# Patient Record
Sex: Male | Born: 1950 | Race: White | Hispanic: No | Marital: Single | Smoking: Never smoker
Health system: Southern US, Community
[De-identification: ages and names within clinical notes are randomized; demographics above are authoritative.]

## PROBLEM LIST (undated history)

## (undated) DIAGNOSIS — K219 Gastro-esophageal reflux disease without esophagitis: Secondary | ICD-10-CM

## (undated) DIAGNOSIS — R569 Unspecified convulsions: Secondary | ICD-10-CM

## (undated) DIAGNOSIS — IMO0001 Reserved for inherently not codable concepts without codable children: Secondary | ICD-10-CM

## (undated) DIAGNOSIS — S0592XA Unspecified injury of left eye and orbit, initial encounter: Secondary | ICD-10-CM

## (undated) DIAGNOSIS — M1990 Unspecified osteoarthritis, unspecified site: Secondary | ICD-10-CM

## (undated) DIAGNOSIS — J45909 Unspecified asthma, uncomplicated: Secondary | ICD-10-CM

## (undated) DIAGNOSIS — C801 Malignant (primary) neoplasm, unspecified: Secondary | ICD-10-CM

## (undated) DIAGNOSIS — I10 Essential (primary) hypertension: Secondary | ICD-10-CM

## (undated) HISTORY — PX: EYE SURGERY: SHX253

## (undated) HISTORY — PX: MOUTH SURGERY: SHX715

## (undated) HISTORY — PX: OTHER SURGICAL HISTORY: SHX169

---

## 2002-07-17 ENCOUNTER — Emergency Department (HOSPITAL_COMMUNITY): Admission: EM | Admit: 2002-07-17 | Discharge: 2002-07-17 | Payer: Self-pay | Admitting: Emergency Medicine

## 2002-07-17 ENCOUNTER — Encounter: Payer: Self-pay | Admitting: Emergency Medicine

## 2014-10-09 ENCOUNTER — Other Ambulatory Visit: Payer: Self-pay | Admitting: Gastroenterology

## 2014-10-09 DIAGNOSIS — K6389 Other specified diseases of intestine: Secondary | ICD-10-CM

## 2014-10-10 ENCOUNTER — Other Ambulatory Visit: Payer: Self-pay | Admitting: Urology

## 2014-10-10 DIAGNOSIS — C61 Malignant neoplasm of prostate: Secondary | ICD-10-CM

## 2014-10-11 ENCOUNTER — Ambulatory Visit
Admission: RE | Admit: 2014-10-11 | Discharge: 2014-10-11 | Disposition: A | Discharge status: Home / Self Care | Payer: PPO | Source: Ambulatory Visit | Attending: Gastroenterology | Admitting: Gastroenterology

## 2014-10-11 DIAGNOSIS — K6389 Other specified diseases of intestine: Secondary | ICD-10-CM

## 2014-10-11 MED ORDER — IOHEXOL 300 MG/ML  SOLN
100.0000 mL | Freq: Once | INTRAMUSCULAR | Status: AC | PRN
  Administered 2014-10-11: 100 mL via INTRAVENOUS

## 2014-10-16 ENCOUNTER — Other Ambulatory Visit (INDEPENDENT_AMBULATORY_CARE_PROVIDER_SITE_OTHER): Payer: Self-pay | Admitting: General Surgery

## 2014-10-16 DIAGNOSIS — D126 Benign neoplasm of colon, unspecified: Secondary | ICD-10-CM

## 2014-10-16 NOTE — H&P (Signed)
[name] 10/16/2014 9:41 AM Location: [facility] Patient #: [id number] DOB: 22-Mar-1951 Single / Language: [name] / Race: White Male History of Present Illness Charles Ruff MD; 04/11/8849 10:09 AM) Patient words: colon mass.  The patient is a 64 year old male who presents with colorectal cancer. This is a 64 year old male who presents to my office with a newly diagnosed sigmoid colon cancer. He underwent a colonoscopy with Dr. Watt Thornton for screening purposes. Of note he has been having rectal bleeding for several months. He denies any weight loss. He has also had changes in his bowel habits which have resulted in crampy abdominal pain and chronic diarrhea. He denies any chest pain with exertion. He has no cardiac history. He is a nonsmoker. He has 1 sister that was diagnosed with colon cancer. Other Problems [name], CMA; 10/16/2014 9:41 AM) Arthritis Asthma Colon Cancer Gastroesophageal Reflux Disease High blood pressure Prostate Cancer  Past Surgical History [name], CMA; 10/16/2014 9:41 AM) Oral Surgery  Medication History [name], CMA; 10/16/2014 9:43 AM) Lisinopril (20MG  Tablet, Oral) Active.  Social History [name], CMA; 10/16/2014 9:41 AM) Alcohol use Moderate alcohol use. Caffeine use Coffee. Illicit drug use Uses socially only. Tobacco use Never smoker.  Family History [name], [name]; 10/16/2014 9:41 AM) Alcohol Abuse Sister. Arthritis Father. Colon Cancer Father, Sister. Diabetes Mellitus Mother. Hypertension Sister. Ischemic Bowel Disease Sister. Prostate Cancer Father.     Review of Systems ([place]; 10/16/2014 9:41 AM) General Present- Appetite Loss, Fatigue and Night Sweats. Not Present- Chills, Fever, Weight Gain and Weight Loss. Skin Not Present- Change in Wart/Mole, Dryness, Hives, Jaundice, New Lesions, Non-Healing Wounds, Rash and Ulcer. HEENT Present- Seasonal Allergies and Wears  glasses/contact lenses. Not Present- Earache, Hearing Loss, Hoarseness, Nose Bleed, Oral Ulcers, Ringing in the Ears, Sinus Pain, Sore Throat, Visual Disturbances and Yellow Eyes. Respiratory Present- Snoring. Not Present- Bloody sputum, Chronic Cough, Difficulty Breathing and Wheezing. Cardiovascular Not Present- Chest Pain, Difficulty Breathing Lying Down, Leg Cramps, Palpitations, Rapid Heart Rate, Shortness of Breath and Swelling of Extremities. Gastrointestinal Present- Abdominal Pain, Excessive gas, Gets full quickly at meals and Indigestion. Not Present- Bloating, Bloody Stool, Change in Bowel Habits, Chronic diarrhea, Constipation, Difficulty Swallowing, Hemorrhoids, Nausea, Rectal Pain and Vomiting. Male Genitourinary Present- Change in Urinary Stream, Frequency and Nocturia. Not Present- Blood in Urine, Impotence, Painful Urination, Urgency and Urine Leakage.  Vitals ([name] CMA; 10/16/2014 9:43 AM) 10/16/2014 9:42 AM Weight: 186 lb Height: 69in Body Surface Area: 2.03 m Body Mass Index: 27.47 kg/m Temp.: 35F(Temporal)  Pulse: 80 (Regular)  BP: 116/70 (Sitting, Left Arm, Standard)     Physical Exam Charles Ruff MD; 10/14/7410 10:10 AM)  General Mental Status-Alert. General Appearance-Consistent with stated age. Hydration-Well hydrated. Voice-Normal.  Head and Neck Head-normocephalic, atraumatic with no lesions or palpable masses. Trachea-midline. Thyroid Gland Characteristics - normal size and consistency.  Eye Eyeball - Bilateral-Extraocular movements intact. Sclera/Conjunctiva - Bilateral-No scleral icterus.  Chest and Lung Exam Chest and lung exam reveals -quiet, even and easy respiratory effort with no use of accessory muscles and on auscultation, normal breath sounds, no adventitious sounds and normal vocal resonance. Inspection Chest Wall - Normal. Back - normal.  Cardiovascular Cardiovascular examination reveals -normal  heart sounds, regular rate and rhythm with no murmurs and normal pedal pulses bilaterally.  Abdomen Inspection Inspection of the abdomen reveals - No Hernias. Palpation/Percussion Palpation and Percussion of the abdomen reveal - Soft, Non Tender, No Rebound tenderness, No Rigidity (guarding) and  No hepatosplenomegaly. Auscultation Auscultation of the abdomen reveals - Bowel sounds normal.  Neurologic Neurologic evaluation reveals -alert and oriented x 3 with no impairment of recent or remote memory. Mental Status-Normal.  Musculoskeletal Global Assessment -Note:no gross deformities.  Normal Exam - Left-Upper Extremity Strength Normal and Lower Extremity Strength Normal. Normal Exam - Right-Upper Extremity Strength Normal and Lower Extremity Strength Normal.    Assessment & Plan Charles Ruff MD; 02/11/8937 10:10 AM)  MALIGNANT NEOPLASM OF SIGMOID COLON (153.3  C18.7) Story: This is a 64 year old male who was recently found to have a sigmoid colon mass on colonoscopy. This appeared partially obstructive. CT scan shows a large sigmoid mass that does not appear to be invading any external structures. There is no signs of metastatic disease. There is a lesion in the right middle lobe and is felt to be benign. We will complete his cancer workup by obtaining a CT scan of the chest and a CEA level. We will then schedule him for robotic sigmoidectomy. I will send this note to Dr. Dorina Thornton as well given his recent diagnosis of prostate cancer.

## 2014-10-19 ENCOUNTER — Inpatient Hospital Stay: Admission: RE | Admit: 2014-10-19 | Payer: PPO | Source: Ambulatory Visit

## 2014-10-19 ENCOUNTER — Ambulatory Visit
Admission: RE | Admit: 2014-10-19 | Discharge: 2014-10-19 | Disposition: A | Discharge status: Home / Self Care | Payer: PPO | Source: Ambulatory Visit | Attending: General Surgery | Admitting: General Surgery

## 2014-10-19 ENCOUNTER — Other Ambulatory Visit: Payer: Self-pay | Admitting: *Deleted

## 2014-10-19 ENCOUNTER — Other Ambulatory Visit: Payer: Self-pay | Admitting: General Surgery

## 2014-10-19 DIAGNOSIS — D126 Benign neoplasm of colon, unspecified: Secondary | ICD-10-CM

## 2014-10-19 MED ORDER — IOHEXOL 300 MG/ML  SOLN
75.0000 mL | Freq: Once | INTRAMUSCULAR | Status: AC | PRN
  Administered 2014-10-19: 75 mL via INTRAVENOUS

## 2014-10-24 ENCOUNTER — Encounter (HOSPITAL_COMMUNITY): Payer: PPO

## 2014-11-01 ENCOUNTER — Encounter (HOSPITAL_COMMUNITY)
Admission: RE | Admit: 2014-11-01 | Discharge: 2014-11-01 | Disposition: A | Discharge status: Home / Self Care | Payer: PPO | Source: Ambulatory Visit | Attending: Urology | Admitting: Urology

## 2014-11-01 DIAGNOSIS — C61 Malignant neoplasm of prostate: Secondary | ICD-10-CM

## 2014-11-01 MED ORDER — TECHNETIUM TC 99M MEDRONATE IV KIT
26.7000 | PACK | Freq: Once | INTRAVENOUS | Status: AC | PRN
  Administered 2014-11-01: 26.7 via INTRAVENOUS

## 2014-11-02 ENCOUNTER — Other Ambulatory Visit: Payer: Self-pay | Admitting: Urology

## 2014-11-06 ENCOUNTER — Encounter (HOSPITAL_COMMUNITY)
Admission: RE | Admit: 2014-11-06 | Discharge: 2014-11-06 | Disposition: A | Discharge status: Home / Self Care | Payer: PPO | Source: Ambulatory Visit | Attending: General Surgery | Admitting: General Surgery

## 2014-11-06 ENCOUNTER — Encounter (HOSPITAL_COMMUNITY): Payer: Self-pay

## 2014-11-06 HISTORY — DX: Reserved for inherently not codable concepts without codable children: IMO0001

## 2014-11-06 HISTORY — DX: Unspecified asthma, uncomplicated: J45.909

## 2014-11-06 HISTORY — DX: Gastro-esophageal reflux disease without esophagitis: K21.9

## 2014-11-06 HISTORY — DX: Unspecified convulsions: R56.9

## 2014-11-06 HISTORY — DX: Unspecified injury of left eye and orbit, initial encounter: S05.92XA

## 2014-11-06 HISTORY — DX: Unspecified osteoarthritis, unspecified site: M19.90

## 2014-11-06 HISTORY — DX: Essential (primary) hypertension: I10

## 2014-11-06 HISTORY — DX: Malignant (primary) neoplasm, unspecified: C80.1

## 2014-11-06 LAB — BASIC METABOLIC PANEL
Anion gap: 7 (ref 5–15)
BUN: 7 mg/dL (ref 6–23)
CHLORIDE: 105 mmol/L (ref 96–112)
CO2: 25 mmol/L (ref 19–32)
Calcium: 8.7 mg/dL (ref 8.4–10.5)
Creatinine, Ser: 0.7 mg/dL (ref 0.50–1.35)
GFR calc Af Amer: >90 mL/min (ref >90)
GFR calc non Af Amer: >90 mL/min (ref >90)
Glucose, Bld: 91 mg/dL (ref 70–99)
Potassium: 4.1 mmol/L (ref 3.5–5.1)
SODIUM: 137 mmol/L (ref 135–145)

## 2014-11-06 LAB — CBC
HCT: 37.4 % — ABNORMAL LOW (ref 39.0–52.0)
HEMOGLOBIN: 11.5 g/dL — AB (ref 13.0–17.0)
MCH: 25.6 pg — AB (ref 26.0–34.0)
MCHC: 30.7 g/dL (ref 30.0–36.0)
MCV: 83.1 fL (ref 78.0–100.0)
PLATELETS: 306 10*3/uL (ref 150–400)
RBC: 4.5 MIL/uL (ref 4.22–5.81)
RDW: 16.6 % — AB (ref 11.5–15.5)
WBC: 6.9 10*3/uL (ref 4.0–10.5)

## 2014-11-06 LAB — ABO/RH: ABO/RH(D): O POS

## 2014-11-06 NOTE — Progress Notes (Signed)
Your patient has screened at an elevated risk for Obstructive Sleep Apnea using the Stop-Bang Tool during a pre-surgical vist. A score of 4 or greater is an elevated risk. Score of 6. 

## 2014-11-06 NOTE — Patient Instructions (Signed)
[place]  11/06/2014   Your procedure is scheduled on:       Wednesday, November 07, 2014   Report to [facility] Main Entrance and follow signs to  [facility] arrive at  6:15 AM.   Call this number if you have problems the morning of surgery [phone] or Presurgical Testing [phone].   Remember:  Do not eat food or drink liquids :After Midnight.  For Living Will and/or Health Care Power Attorney Forms: please provide copy for your medical record, may bring AM of surgery (forms should be already notarized-we do not provide this service).  Remember: follow any bowel prep instructions per MD office.   Take these medicines the morning of surgery with A SIP OF WATER: NONE                               You may not have any metal on your body including hair pins and piercings  Do not wear jewelry,colognes, lotions, powders, or deodorant.  Men may shave face and neck.               Do not bring valuables to the hospital. [place].  Contacts, dentures or bridgework may not be worn into surgery.  Leave suitcase in the car. After surgery it may be brought to your room.  For patients admitted to the hospital, checkout time is 11:00 AM the day of discharge.   Special Instructions: review fact sheets for Blood Transfusion fact sheet.  Remember: Type/Screen "Blue armsbands"- may not be removed once applied(would result in being retested AM of surgery, if removed). ________________________________________________________________________  [facility] - Preparing for Surgery Before surgery, you can play an important role.  Because skin is not sterile, your skin needs to be as free of germs as possible.  You can reduce the number of germs on your skin by washing with CHG (chlorahexidine gluconate) soap before surgery.  CHG is an antiseptic cleaner which kills germs and bonds with the skin to continue killing germs even after  washing. Please DO NOT use if you have an allergy to CHG or antibacterial soaps.  If your skin becomes reddened/irritated stop using the CHG and inform your nurse when you arrive at Short Stay. Do not shave (including legs and underarms) for at least 48 hours prior to the first CHG shower.  You may shave your face/neck. Please follow these instructions carefully:  1.  Shower with CHG Soap the night before surgery and the  morning of Surgery.  2.  If you choose to wash your hair, wash your hair first as usual with your  normal  shampoo.  3.  After you shampoo, rinse your hair and body thoroughly to remove the  shampoo.                           4.  Use CHG as you would any other liquid soap.  You can apply chg directly  to the skin and wash                       Gently with a scrungie or clean washcloth.  5.  Apply the CHG Soap to your body ONLY FROM THE NECK DOWN.   Do not use on face/ open  Wound or open sores. Avoid contact with eyes, ears mouth and genitals (private parts).                       Wash face,  Genitals (private parts) with your normal soap.             6.  Wash thoroughly, paying special attention to the area where your surgery  will be performed.  7.  Thoroughly rinse your body with warm water from the neck down.  8.  DO NOT shower/wash with your normal soap after using and rinsing off  the CHG Soap.                9.  Pat yourself dry with a clean towel.            10.  Wear clean pajamas.            11.  Place clean sheets on your bed the night of your first shower and do not  sleep with pets. Day of Surgery : Do not apply any lotions/deodorants the morning of surgery.  Please wear clean clothes to the hospital/surgery center.  FAILURE TO FOLLOW THESE INSTRUCTIONS MAY RESULT IN THE CANCELLATION OF YOUR SURGERY PATIENT SIGNATURE_________________________________  NURSE  SIGNATURE__________________________________  ________________________________________________________________________

## 2014-11-06 NOTE — Progress Notes (Addendum)
CT Chest epic 10/19/2014  CBC results per epic per PAT visit 11/06/2014 sent to Joyice Faster MD

## 2014-11-07 ENCOUNTER — Inpatient Hospital Stay (HOSPITAL_COMMUNITY): Payer: PPO | Admitting: Anesthesiology

## 2014-11-07 ENCOUNTER — Inpatient Hospital Stay (HOSPITAL_COMMUNITY)
Admission: RE | Admit: 2014-11-07 | Discharge: 2014-11-11 | DRG: 707 | Disposition: A | Discharge status: Home / Self Care | Payer: PPO | Source: Ambulatory Visit | Attending: General Surgery | Admitting: General Surgery

## 2014-11-07 ENCOUNTER — Encounter (HOSPITAL_COMMUNITY)
Admission: RE | Disposition: A | Discharge status: Home / Self Care | Payer: Self-pay | Source: Ambulatory Visit | Attending: General Surgery

## 2014-11-07 ENCOUNTER — Encounter (HOSPITAL_COMMUNITY): Payer: Self-pay | Admitting: *Deleted

## 2014-11-07 DIAGNOSIS — D62 Acute posthemorrhagic anemia: Secondary | ICD-10-CM | POA: Diagnosis not present

## 2014-11-07 DIAGNOSIS — C187 Malignant neoplasm of sigmoid colon: Secondary | ICD-10-CM | POA: Diagnosis present

## 2014-11-07 DIAGNOSIS — K219 Gastro-esophageal reflux disease without esophagitis: Secondary | ICD-10-CM | POA: Diagnosis present

## 2014-11-07 DIAGNOSIS — C61 Malignant neoplasm of prostate: Secondary | ICD-10-CM | POA: Diagnosis present

## 2014-11-07 DIAGNOSIS — Z79899 Other long term (current) drug therapy: Secondary | ICD-10-CM

## 2014-11-07 DIAGNOSIS — Z7952 Long term (current) use of systemic steroids: Secondary | ICD-10-CM

## 2014-11-07 DIAGNOSIS — J45909 Unspecified asthma, uncomplicated: Secondary | ICD-10-CM | POA: Diagnosis present

## 2014-11-07 DIAGNOSIS — C189 Malignant neoplasm of colon, unspecified: Secondary | ICD-10-CM | POA: Diagnosis present

## 2014-11-07 DIAGNOSIS — Z800 Family history of malignant neoplasm of digestive organs: Secondary | ICD-10-CM

## 2014-11-07 DIAGNOSIS — Z01812 Encounter for preprocedural laboratory examination: Secondary | ICD-10-CM | POA: Diagnosis not present

## 2014-11-07 DIAGNOSIS — I10 Essential (primary) hypertension: Secondary | ICD-10-CM | POA: Diagnosis present

## 2014-11-07 HISTORY — PX: ROBOT ASSISTED LAPAROSCOPIC RADICAL PROSTATECTOMY: SHX5141

## 2014-11-07 HISTORY — PX: PROCTOSCOPY: SHX2266

## 2014-11-07 LAB — TYPE AND SCREEN
ABO/RH(D): O POS
ANTIBODY SCREEN: NEGATIVE

## 2014-11-07 LAB — HEMOGLOBIN A1C
Hgb A1c MFr Bld: 6 % — ABNORMAL HIGH (ref 4.8–5.6)
Mean Plasma Glucose: 126 mg/dL

## 2014-11-07 SURGERY — COLECTOMY, PARTIAL, ROBOT-ASSISTED, LAPAROSCOPIC
Anesthesia: General | Site: Abdomen

## 2014-11-07 MED ORDER — HEPARIN SODIUM (PORCINE) 5000 UNIT/ML IJ SOLN
5000.0000 [IU] | Freq: Three times a day (TID) | INTRAMUSCULAR | Status: DC
  Filled 2014-11-07: qty 1

## 2014-11-07 MED ORDER — ONDANSETRON HCL 4 MG/2ML IJ SOLN
INTRAMUSCULAR | Status: AC
  Filled 2014-11-07: qty 2

## 2014-11-07 MED ORDER — HYDRALAZINE HCL 20 MG/ML IJ SOLN
INTRAMUSCULAR | Status: AC
  Filled 2014-11-07: qty 1

## 2014-11-07 MED ORDER — INDOCYANINE GREEN 25 MG IV SOLR
INTRAVENOUS | Status: DC | PRN
  Administered 2014-11-07: .4 mg

## 2014-11-07 MED ORDER — BUPIVACAINE-EPINEPHRINE 0.25% -1:200000 IJ SOLN
INTRAMUSCULAR | Status: DC | PRN
  Administered 2014-11-07: 24 mL

## 2014-11-07 MED ORDER — LACTATED RINGERS IR SOLN
Status: DC | PRN
  Administered 2014-11-07: 2000 mL

## 2014-11-07 MED ORDER — DIPHENHYDRAMINE HCL 50 MG/ML IJ SOLN: 12.5000 mg | Freq: Four times a day (QID) | INTRAMUSCULAR | Status: DC | PRN

## 2014-11-07 MED ORDER — MIDAZOLAM HCL 5 MG/5ML IJ SOLN
INTRAMUSCULAR | Status: DC | PRN
  Administered 2014-11-07: 2 mg via INTRAVENOUS

## 2014-11-07 MED ORDER — PHENYLEPHRINE HCL 10 MG/ML IJ SOLN
INTRAMUSCULAR | Status: DC | PRN
  Administered 2014-11-07: 80 ug via INTRAVENOUS
  Administered 2014-11-07: 40 ug via INTRAVENOUS

## 2014-11-07 MED ORDER — 0.9 % SODIUM CHLORIDE (POUR BTL) OPTIME
TOPICAL | Status: DC | PRN
  Administered 2014-11-07: 2000 mL

## 2014-11-07 MED ORDER — HYDROMORPHONE HCL 1 MG/ML IJ SOLN
INTRAMUSCULAR | Status: AC
  Filled 2014-11-07: qty 1

## 2014-11-07 MED ORDER — SODIUM CHLORIDE 0.9 % IJ SOLN: 9.0000 mL | INTRAMUSCULAR | Status: DC | PRN

## 2014-11-07 MED ORDER — LACTATED RINGERS IV SOLN
INTRAVENOUS | Status: DC | PRN
  Administered 2014-11-07 (×2): via INTRAVENOUS

## 2014-11-07 MED ORDER — ACETAMINOPHEN 500 MG PO TABS
1000.0000 mg | ORAL_TABLET | Freq: Four times a day (QID) | ORAL | Status: AC
  Filled 2014-11-07 (×2): qty 2

## 2014-11-07 MED ORDER — SUFENTANIL CITRATE 50 MCG/ML IV SOLN
INTRAVENOUS | Status: AC
  Filled 2014-11-07: qty 1

## 2014-11-07 MED ORDER — DEXTROSE 5 % IV SOLN
2.0000 g | Freq: Two times a day (BID) | INTRAVENOUS | Status: DC
  Administered 2014-11-07 – 2014-11-08 (×4): 2 g via INTRAVENOUS
  Filled 2014-11-07 (×6): qty 2

## 2014-11-07 MED ORDER — MIDAZOLAM HCL 2 MG/2ML IJ SOLN
INTRAMUSCULAR | Status: AC
  Filled 2014-11-07: qty 2

## 2014-11-07 MED ORDER — METOCLOPRAMIDE HCL 5 MG/ML IJ SOLN
INTRAMUSCULAR | Status: AC
  Filled 2014-11-07: qty 2

## 2014-11-07 MED ORDER — CEFOTETAN DISODIUM 2 G IJ SOLR: 2.0000 g | Freq: Two times a day (BID) | INTRAMUSCULAR | Status: DC

## 2014-11-07 MED ORDER — CEFOTETAN DISODIUM-DEXTROSE 2-2.08 GM-% IV SOLR
INTRAVENOUS | Status: AC
  Filled 2014-11-07: qty 50

## 2014-11-07 MED ORDER — LABETALOL HCL 5 MG/ML IV SOLN
INTRAVENOUS | Status: DC | PRN
  Administered 2014-11-07 (×2): 5 mg via INTRAVENOUS

## 2014-11-07 MED ORDER — KCL IN DEXTROSE-NACL 20-5-0.45 MEQ/L-%-% IV SOLN: INTRAVENOUS | Status: DC

## 2014-11-07 MED ORDER — MORPHINE SULFATE (PF) 1 MG/ML IV SOLN
INTRAVENOUS | Status: AC
  Filled 2014-11-07: qty 25

## 2014-11-07 MED ORDER — CEFOTETAN DISODIUM 2 G IJ SOLR
2.0000 g | INTRAMUSCULAR | Status: AC
  Administered 2014-11-07: 2 g via INTRAVENOUS

## 2014-11-07 MED ORDER — ENOXAPARIN SODIUM 40 MG/0.4ML ~~LOC~~ SOLN
40.0000 mg | SUBCUTANEOUS | Status: DC
  Administered 2014-11-08 – 2014-11-11 (×4): 40 mg via SUBCUTANEOUS
  Filled 2014-11-07 (×6): qty 0.4

## 2014-11-07 MED ORDER — HYDRALAZINE HCL 20 MG/ML IJ SOLN
10.0000 mg | Freq: Once | INTRAMUSCULAR | Status: AC
  Administered 2014-11-07: 10 mg via INTRAVENOUS

## 2014-11-07 MED ORDER — LABETALOL HCL 5 MG/ML IV SOLN
INTRAVENOUS | Status: AC
  Filled 2014-11-07: qty 4

## 2014-11-07 MED ORDER — LACTATED RINGERS IV SOLN: INTRAVENOUS | Status: DC

## 2014-11-07 MED ORDER — SODIUM CHLORIDE 0.9 % IV BOLUS (SEPSIS)
500.0000 mL | Freq: Once | INTRAVENOUS | Status: AC
  Administered 2014-11-07: 500 mL via INTRAVENOUS

## 2014-11-07 MED ORDER — KCL IN DEXTROSE-NACL 20-5-0.45 MEQ/L-%-% IV SOLN
INTRAVENOUS | Status: DC
  Administered 2014-11-07 – 2014-11-08 (×2): 100 mL via INTRAVENOUS
  Administered 2014-11-08: 19:00:00 via INTRAVENOUS
  Filled 2014-11-07 (×4): qty 1000

## 2014-11-07 MED ORDER — BUPIVACAINE-EPINEPHRINE (PF) 0.25% -1:200000 IJ SOLN
INTRAMUSCULAR | Status: AC
Start: 2014-11-07 — End: 2014-11-07
  Filled 2014-11-07: qty 30

## 2014-11-07 MED ORDER — LACTATED RINGERS IV SOLN
INTRAVENOUS | Status: DC | PRN
  Administered 2014-11-07: 09:00:00 via INTRAVENOUS

## 2014-11-07 MED ORDER — NEOSTIGMINE METHYLSULFATE 10 MG/10ML IV SOLN
INTRAVENOUS | Status: DC | PRN
  Administered 2014-11-07: 5 mg via INTRAVENOUS

## 2014-11-07 MED ORDER — SUFENTANIL CITRATE 50 MCG/ML IV SOLN
INTRAVENOUS | Status: DC | PRN
  Administered 2014-11-07 (×8): 10 ug via INTRAVENOUS
  Administered 2014-11-07: 20 ug via INTRAVENOUS

## 2014-11-07 MED ORDER — ONDANSETRON HCL 4 MG/2ML IJ SOLN
INTRAMUSCULAR | Status: DC | PRN
Start: 2014-11-07 — End: 2014-11-07
  Administered 2014-11-07: 4 mg via INTRAVENOUS

## 2014-11-07 MED ORDER — CISATRACURIUM BESYLATE 20 MG/10ML IV SOLN
INTRAVENOUS | Status: AC
  Filled 2014-11-07: qty 20

## 2014-11-07 MED ORDER — NALOXONE HCL 0.4 MG/ML IJ SOLN: 0.4000 mg | INTRAMUSCULAR | Status: DC | PRN

## 2014-11-07 MED ORDER — SODIUM CHLORIDE 0.9 % IJ SOLN
INTRAMUSCULAR | Status: AC
  Filled 2014-11-07: qty 10

## 2014-11-07 MED ORDER — HEPARIN SODIUM (PORCINE) 5000 UNIT/ML IJ SOLN
5000.0000 [IU] | Freq: Three times a day (TID) | INTRAMUSCULAR | Status: AC
  Administered 2014-11-07: 5000 [IU] via SUBCUTANEOUS

## 2014-11-07 MED ORDER — DIPHENHYDRAMINE HCL 12.5 MG/5ML PO ELIX: 12.5000 mg | ORAL_SOLUTION | Freq: Four times a day (QID) | ORAL | Status: DC | PRN

## 2014-11-07 MED ORDER — HYDROCODONE-ACETAMINOPHEN 5-325 MG PO TABS: 1.0000 | ORAL_TABLET | Freq: Four times a day (QID) | ORAL | Status: DC | PRN

## 2014-11-07 MED ORDER — DEXAMETHASONE SODIUM PHOSPHATE 10 MG/ML IJ SOLN
INTRAMUSCULAR | Status: DC | PRN
  Administered 2014-11-07: 10 mg via INTRAVENOUS

## 2014-11-07 MED ORDER — MORPHINE SULFATE (PF) 1 MG/ML IV SOLN
INTRAVENOUS | Status: DC
  Administered 2014-11-07: 16:00:00 via INTRAVENOUS
  Administered 2014-11-07: 7.5 mg via INTRAVENOUS
  Administered 2014-11-08: 2.1 mg via INTRAVENOUS
  Administered 2014-11-08 (×2): 6 mg via INTRAVENOUS
  Administered 2014-11-08: 7.5 mg via INTRAVENOUS
  Administered 2014-11-08: 6 mg via INTRAVENOUS
  Administered 2014-11-08 (×2): 3 mg via INTRAVENOUS
  Administered 2014-11-09: 1.5 mg via INTRAVENOUS
  Administered 2014-11-09: 4.5 mg via INTRAVENOUS
  Administered 2014-11-09: 6 mg via INTRAVENOUS
  Administered 2014-11-09: 4.5 mg via INTRAVENOUS
  Filled 2014-11-07 (×2): qty 25

## 2014-11-07 MED ORDER — DEXAMETHASONE SODIUM PHOSPHATE 10 MG/ML IJ SOLN
INTRAMUSCULAR | Status: AC
  Filled 2014-11-07: qty 1

## 2014-11-07 MED ORDER — GLYCOPYRROLATE 0.2 MG/ML IJ SOLN
INTRAMUSCULAR | Status: DC | PRN
  Administered 2014-11-07: .8 mg via INTRAVENOUS

## 2014-11-07 MED ORDER — CISATRACURIUM BESYLATE (PF) 10 MG/5ML IV SOLN
INTRAVENOUS | Status: DC | PRN
  Administered 2014-11-07: 4 mg via INTRAVENOUS
  Administered 2014-11-07: 6 mg via INTRAVENOUS
  Administered 2014-11-07: 3 mg via INTRAVENOUS
  Administered 2014-11-07: 6 mg via INTRAVENOUS
  Administered 2014-11-07: 2 mg via INTRAVENOUS
  Administered 2014-11-07: 3 mg via INTRAVENOUS
  Administered 2014-11-07: 14 mg via INTRAVENOUS

## 2014-11-07 MED ORDER — INDOCYANINE GREEN 25 MG IV SOLR
INTRAVENOUS | Status: DC | PRN
  Administered 2014-11-07: 6.25 mg via INTRAVENOUS

## 2014-11-07 MED ORDER — PROMETHAZINE HCL 25 MG/ML IJ SOLN: 6.2500 mg | INTRAMUSCULAR | Status: DC | PRN

## 2014-11-07 MED ORDER — PROPOFOL 10 MG/ML IV BOLUS
INTRAVENOUS | Status: DC | PRN
  Administered 2014-11-07: 150 mg via INTRAVENOUS

## 2014-11-07 MED ORDER — ONDANSETRON HCL 4 MG/2ML IJ SOLN
4.0000 mg | Freq: Four times a day (QID) | INTRAMUSCULAR | Status: DC | PRN
Start: 2014-11-07 — End: 2014-11-09

## 2014-11-07 MED ORDER — CIPROFLOXACIN HCL 500 MG PO TABS: 500.0000 mg | ORAL_TABLET | Freq: Two times a day (BID) | ORAL | Status: DC

## 2014-11-07 MED ORDER — ALVIMOPAN 12 MG PO CAPS
12.0000 mg | ORAL_CAPSULE | Freq: Once | ORAL | Status: AC
  Administered 2014-11-07: 12 mg via ORAL
  Filled 2014-11-07: qty 1

## 2014-11-07 MED ORDER — METOCLOPRAMIDE HCL 5 MG/ML IJ SOLN
INTRAMUSCULAR | Status: DC | PRN
  Administered 2014-11-07: 10 mg via INTRAVENOUS

## 2014-11-07 MED ORDER — PROPOFOL 10 MG/ML IV BOLUS
INTRAVENOUS | Status: AC
  Filled 2014-11-07: qty 20

## 2014-11-07 MED ORDER — HYDROMORPHONE HCL 1 MG/ML IJ SOLN
0.2500 mg | INTRAMUSCULAR | Status: DC | PRN
  Administered 2014-11-07 (×2): 0.5 mg via INTRAVENOUS

## 2014-11-07 MED ORDER — HYDRALAZINE HCL 20 MG/ML IJ SOLN
INTRAMUSCULAR | Status: DC | PRN
  Administered 2014-11-07 (×2): 4 mg via INTRAVENOUS

## 2014-11-07 SURGICAL SUPPLY — 115 items
BLADE EXTENDED COATED 6.5IN (ELECTRODE) ×4 IMPLANT
BLADE SURG SZ11 CARB STEEL (BLADE) ×4 IMPLANT
CABLE HIGH FREQUENCY MONO STRZ (ELECTRODE) ×2 IMPLANT
CANNULA REDUC XI 12-8 STAPL (CANNULA) ×1
CANNULA REDUC XI 12-8MM STAPL (CANNULA) ×1
CANNULA REDUCER 12-8 DVNC XI (CANNULA) ×2 IMPLANT
CATH COUDE 24FR 5CC (CATHETERS) IMPLANT
CATH FOLEY 2WAY SLVR 18FR 30CC (CATHETERS) ×4 IMPLANT
CATH RIBBED COUDE  30CC (CATHETERS) ×2
CATH RIBBED COUDE 30CC (CATHETERS) ×2
CATH ROBINSON RED A/P 16FR (CATHETERS) ×4 IMPLANT
CATH TIEMANN FOLEY 18FR 5CC (CATHETERS) ×4 IMPLANT
CELLS DAT CNTRL 66122 CELL SVR (MISCELLANEOUS) IMPLANT
CHLORAPREP W/TINT 26ML (MISCELLANEOUS) ×4 IMPLANT
CLIP LIGATING HEM O LOK PURPLE (MISCELLANEOUS) ×14 IMPLANT
CLIP LIGATING HEMOLOK MED (MISCELLANEOUS) IMPLANT
CLOTH BEACON ORANGE TIMEOUT ST (SAFETY) ×2 IMPLANT
COVER MAYO STAND STRL (DRAPES) ×2 IMPLANT
COVER TIP SHEARS 8 DVNC (MISCELLANEOUS) ×4 IMPLANT
COVER TIP SHEARS 8MM DA VINCI (MISCELLANEOUS) ×4
CUTTER ECHEON FLEX ENDO 45 340 (ENDOMECHANICALS) ×6 IMPLANT
DECANTER SPIKE VIAL GLASS SM (MISCELLANEOUS) ×8 IMPLANT
DEVICE TROCAR PUNCTURE CLOSURE (ENDOMECHANICALS) IMPLANT
DRAPE ARM DVNC X/XI (DISPOSABLE) ×16 IMPLANT
DRAPE COLUMN DVNC XI (DISPOSABLE) ×4 IMPLANT
DRAPE DA VINCI XI ARM (DISPOSABLE) ×16
DRAPE DA VINCI XI COLUMN (DISPOSABLE) ×4
DRAPE LAPAROSCOPIC ABDOMINAL (DRAPES) ×4 IMPLANT
DRAPE SURG IRRIG POUCH 19X23 (DRAPES) ×4 IMPLANT
DRSG OPSITE POSTOP 4X10 (GAUZE/BANDAGES/DRESSINGS) IMPLANT
DRSG OPSITE POSTOP 4X6 (GAUZE/BANDAGES/DRESSINGS) IMPLANT
DRSG OPSITE POSTOP 4X8 (GAUZE/BANDAGES/DRESSINGS) IMPLANT
DRSG TEGADERM 4X4.75 (GAUZE/BANDAGES/DRESSINGS) ×6 IMPLANT
DRSG TEGADERM 6X8 (GAUZE/BANDAGES/DRESSINGS) ×8 IMPLANT
ELECT PENCIL ROCKER SW 15FT (MISCELLANEOUS) ×8 IMPLANT
ELECT REM PT RETURN 15FT ADLT (MISCELLANEOUS) ×4 IMPLANT
ELECT REM PT RETURN 9FT ADLT (ELECTROSURGICAL) ×4
ELECTRODE REM PT RTRN 9FT ADLT (ELECTROSURGICAL) ×2 IMPLANT
EVACUATOR SILICONE 100CC (DRAIN) ×2 IMPLANT
GAUZE SPONGE 2X2 8PLY STRL LF (GAUZE/BANDAGES/DRESSINGS) ×2 IMPLANT
GAUZE SPONGE 4X4 12PLY STRL (GAUZE/BANDAGES/DRESSINGS) IMPLANT
GLOVE BIO SURGEON STRL SZ 6.5 (GLOVE) ×12 IMPLANT
GLOVE BIO SURGEONS STRL SZ 6.5 (GLOVE) ×4
GLOVE BIOGEL M STRL SZ7.5 (GLOVE) ×8 IMPLANT
GLOVE BIOGEL PI IND STRL 7.0 (GLOVE) ×6 IMPLANT
GLOVE BIOGEL PI INDICATOR 7.0 (GLOVE) ×6
GOWN STRL REUS W/TWL 2XL LVL3 (GOWN DISPOSABLE) ×12 IMPLANT
GOWN STRL REUS W/TWL LRG LVL3 (GOWN DISPOSABLE) ×8 IMPLANT
GOWN STRL REUS W/TWL LRG LVL4 (GOWN DISPOSABLE) ×12 IMPLANT
GOWN STRL REUS W/TWL XL LVL3 (GOWN DISPOSABLE) ×16 IMPLANT
HOLDER FOLEY CATH W/STRAP (MISCELLANEOUS) ×4 IMPLANT
IV LACTATED RINGERS 1000ML (IV SOLUTION) ×4 IMPLANT
KIT PROCEDURE DA VINCI SI (MISCELLANEOUS)
KIT PROCEDURE DVNC SI (MISCELLANEOUS) ×2 IMPLANT
LEGGING LITHOTOMY PAIR STRL (DRAPES) ×4 IMPLANT
LIQUID BAND (GAUZE/BANDAGES/DRESSINGS) ×4 IMPLANT
NDL INSUFFLATION 14GA 120MM (NEEDLE) ×4 IMPLANT
NEEDLE INSUFFLATION 14GA 120MM (NEEDLE) ×8 IMPLANT
PACK CARDIOVASCULAR III (CUSTOM PROCEDURE TRAY) ×4 IMPLANT
PACK COLON (CUSTOM PROCEDURE TRAY) ×4 IMPLANT
PACK GENERAL/GYN (CUSTOM PROCEDURE TRAY) ×4 IMPLANT
PACK ROBOT UROLOGY CUSTOM (CUSTOM PROCEDURE TRAY) ×4 IMPLANT
PAD POSITIONING PINK XL (MISCELLANEOUS) ×2 IMPLANT
PLUG CATH AND CAP STER (CATHETERS) ×2 IMPLANT
PORT LAP GEL ALEXIS MED 5-9CM (MISCELLANEOUS) IMPLANT
RELOAD GREEN ECHELON 45 (STAPLE) ×4 IMPLANT
RELOAD STAPLE 45 BLU REG DVNC (STAPLE) IMPLANT
RETRACTOR WND ALEXIS 18 MED (MISCELLANEOUS) IMPLANT
RTRCTR WOUND ALEXIS 18CM MED (MISCELLANEOUS)
SCISSORS LAP 5X45 EPIX DISP (ENDOMECHANICALS) ×4 IMPLANT
SEAL CANN UNIV 5-8 DVNC XI (MISCELLANEOUS) ×14 IMPLANT
SEAL XI 5MM-8MM UNIVERSAL (MISCELLANEOUS) ×16
SEALER VESSEL DA VINCI XI (MISCELLANEOUS) ×2
SEALER VESSEL EXT DVNC XI (MISCELLANEOUS) ×2 IMPLANT
SET IRRIG TUBING LAPAROSCOPIC (IRRIGATION / IRRIGATOR) ×4 IMPLANT
SET TUBE IRRIG SUCTION NO TIP (IRRIGATION / IRRIGATOR) ×4 IMPLANT
SHEET LAVH (DRAPES) IMPLANT
SLEEVE XCEL OPT CAN 5 100 (ENDOMECHANICALS) IMPLANT
SOLUTION ANTI FOG 6CC (MISCELLANEOUS) ×2 IMPLANT
SOLUTION ELECTROLUBE (MISCELLANEOUS) ×8 IMPLANT
SPONGE GAUZE 2X2 STER 10/PKG (GAUZE/BANDAGES/DRESSINGS) ×2
SPONGE LAP 4X18 X RAY DECT (DISPOSABLE) ×4 IMPLANT
STAPLER 45 BLU RELOAD XI (STAPLE) ×6 IMPLANT
STAPLER 45 BLUE RELOAD XI (STAPLE) ×6
STAPLER CANNULA SEAL DVNC XI (STAPLE) IMPLANT
STAPLER CANNULA SEAL XI (STAPLE) ×2
STAPLER CIRC ILS CVD 33MM 37CM (STAPLE) ×2 IMPLANT
STAPLER SHEATH (SHEATH)
STAPLER SHEATH ENDOWRIST DVNC (SHEATH) IMPLANT
STAPLER VISISTAT 35W (STAPLE) ×4 IMPLANT
SUT ETHILON 3 0 PS 1 (SUTURE) ×4 IMPLANT
SUT NOVA NAB DX-16 0-1 5-0 T12 (SUTURE) ×4 IMPLANT
SUT PROLENE 2 0 KS (SUTURE) ×2 IMPLANT
SUT PROLENE 2 0 SH DA (SUTURE) ×2 IMPLANT
SUT SILK 2 0 SH CR/8 (SUTURE) ×4 IMPLANT
SUT SILK 3 0 SH CR/8 (SUTURE) ×4 IMPLANT
SUT VIC AB 2-0 SH 18 (SUTURE) IMPLANT
SUT VIC AB 4-0 PS2 18 (SUTURE) ×2 IMPLANT
SUT VLOC 180 2-0 9IN GS21 (SUTURE) ×4 IMPLANT
SUT VLOC BARB 180 ABS3/0GR12 (SUTURE) ×12
SUTURE VLOC BRB 180 ABS3/0GR12 (SUTURE) ×6 IMPLANT
SYR 27GX1/2 1ML LL SAFETY (SYRINGE) ×4 IMPLANT
SYR 30ML LL (SYRINGE) ×2 IMPLANT
SYRINGE 10CC LL (SYRINGE) ×4 IMPLANT
SYS LAPSCP GELPORT 120MM (MISCELLANEOUS)
SYSTEM LAPSCP GELPORT 120MM (MISCELLANEOUS) IMPLANT
TOWEL OR 17X26 10 PK STRL BLUE (TOWEL DISPOSABLE) ×4 IMPLANT
TOWEL OR NON WOVEN STRL DISP B (DISPOSABLE) ×8 IMPLANT
TRAY FOLEY CATH 14FRSI W/METER (CATHETERS) ×2 IMPLANT
TROCAR BLADELESS OPT 5 100 (ENDOMECHANICALS) ×6 IMPLANT
TROCAR XCEL 12X100 BLDLESS (ENDOMECHANICALS) ×2 IMPLANT
TUBING CONNECTING 10 (TUBING) IMPLANT
TUBING CONNECTING 10' (TUBING)
TUBING FILTER THERMOFLATOR (ELECTROSURGICAL) ×4 IMPLANT
WATER STERILE IRR 1500ML POUR (IV SOLUTION) ×8 IMPLANT

## 2014-11-07 NOTE — Anesthesia Postprocedure Evaluation (Signed)
  Anesthesia Post-op Note  Patient: [name]  Procedure(s) Performed: Procedure(s) (LRB): XI ROBOT ASSISTED LAPAROSCOPIC PARTIAL COLECTOMY (N/A) XI ROBOTIC ASSISTED LAPAROSCOPIC RADICAL PROSTATECTOMY WITH NODES (N/A) PROCTOSCOPY  Patient Location: PACU  Anesthesia Type: General  Level of Consciousness: awake and alert   Airway and Oxygen Therapy: Patient Spontanous Breathing  Post-op Pain: mild  Post-op Assessment: Post-op Vital signs reviewed, Patient's Cardiovascular Status Stable, Respiratory Function Stable, Patent Airway and No signs of Nausea or vomiting  Last Vitals:  Filed Vitals:   11/07/14 1630  BP: 167/72  Pulse: 99  Temp:   Resp: 16    Post-op Vital Signs: stable   Complications: No apparent anesthesia complications

## 2014-11-07 NOTE — Brief Op Note (Signed)
11/07/2014  3:11 PM  PATIENT:  [name]  64 y.o. male  PRE-OPERATIVE DIAGNOSIS:  High risk prostate cancer  POST-OPERATIVE DIAGNOSIS:   High risk prostate cancer  PROCEDURE:  Procedure(s): XI ROBOT ASSISTED LAPAROSCOPIC PARTIAL COLECTOMY (N/A) XI ROBOTIC ASSISTED LAPAROSCOPIC RADICAL PROSTATECTOMY WITH NODES (N/A)  SURGEON:  Surgeon(s) and Role: Panel 1:    * Michael Boston, MD - Assisting    * [name], MD - Primary    * [name], MD - Primary  Panel 2:    * Alexis Frock, MD - Primary Clemetine Marker, [place]  PHYSICIAN ASSISTANT:   ASSISTANTS: as per above   ANESTHESIA:   general  EBL:  Total I/O In: 1800 [I.V.:1800] Out: -   BLOOD ADMINISTERED:none  DRAINS: foley to gravity, JP to bulb   LOCAL MEDICATIONS USED:  MARCAINE     SPECIMEN:  Source of Specimen:  radical prostatectomy, peri-prostatic fat, bilateral pelvic lymph nodes  DISPOSITION OF SPECIMEN:  PATHOLOGY  COUNTS:  YES  TOURNIQUET:  * No tourniquets in log *  DICTATION: .Other Dictation: Dictation Number [id number]  PLAN OF CARE: Admit to inpatient   PATIENT DISPOSITION:  PACU - hemodynamically stable.   Delay start of Pharmacological VTE agent (>24hrs) due to surgical blood loss or risk of bleeding: not applicable

## 2014-11-07 NOTE — Transfer of Care (Signed)
Immediate Anesthesia Transfer of Care Note  Patient: [name]  Procedure(s) Performed: Procedure(s): XI ROBOT ASSISTED LAPAROSCOPIC PARTIAL COLECTOMY (N/A) XI ROBOTIC ASSISTED LAPAROSCOPIC RADICAL PROSTATECTOMY WITH NODES (N/A)  Patient Location: PACU  Anesthesia Type:General  Level of Consciousness: awake, alert  and oriented  Airway & Oxygen Therapy: Patient Spontanous Breathing and Patient connected to face mask oxygen  Post-op Assessment: Report given to RN and Post -op Vital signs reviewed and stable  Post vital signs: Reviewed and stable  Last Vitals:  Filed Vitals:   11/07/14 0624  BP: 140/89  Pulse: 91  Temp: 36.6 C  Resp: 18    Complications: No apparent anesthesia complications

## 2014-11-07 NOTE — Progress Notes (Signed)
Aline Lt radial removed intact. Site unremarkable. Pressure held x 5 minutes without bleeding or hematoma formation.

## 2014-11-07 NOTE — OR Nursing (Signed)
robot was undock and patient was level out, Dr Tresa Moore reposition and robot was re docked  Johnell Comings RN

## 2014-11-07 NOTE — Interval H&P Note (Signed)
History and Physical Interval Note:  11/07/2014 8:34 AM  [name]  has presented today for surgery, with the diagnosis of colon cancer  The various methods of treatment have been discussed with the patient and family. After consideration of risks, benefits and other options for treatment, the patient has consented to  Procedure(s): XI ROBOT ASSISTED LAPAROSCOPIC PARTIAL COLECTOMY (N/A) XI ROBOTIC ASSISTED LAPAROSCOPIC RADICAL PROSTATECTOMY WITH NODES (N/A) as a surgical intervention .  The patient's history has been reviewed, patient examined, no change in status, stable for surgery.  I have reviewed the patient's chart and labs.  Questions were answered to the patient's satisfaction.     The surgery and anatomy were described to the patient as well as the risks of surgery and the possible complications.  These include: Bleeding, deep abdominal infections and possible wound complications such as hernia and infection, damage to adjacent structures, leak of surgical connections, which can lead to other surgeries and possibly an ostomy, possible need for other procedures, such as abscess drains in radiology, possible prolonged hospital stay, possible diarrhea from removal of part of the colon, possible constipation from narcotics, prolonged fatigue/weakness or appetite loss, possible early recurrence of of disease, possible complications of their medical problems such as heart disease or arrhythmias or lung problems, death (less than 1%). I believe the patient understands and wishes to proceed with the surgery.  Rosario Adie, MD  [facility]

## 2014-11-07 NOTE — Op Note (Signed)
11/07/2014  11:25 AM  PATIENT:  [name]  64 y.o. male  Patient Care Team: Lujean Amel, MD as PCP - General (Family Medicine)  PRE-OPERATIVE DIAGNOSIS:  colon cancer  POST-OPERATIVE DIAGNOSIS:  Sigmoid colon cancer  PROCEDURE:  XI ROBOT ASSISTED SIGMOIDECTOMY XI ROBOTIC ASSISTED LAPAROSCOPIC RADICAL PROSTATECTOMY WITH NODES  Surgeon(s): [name], MD Alexis Frock, MD Michael Boston, MD  ASSISTANT: Johney Maine   ANESTHESIA:   local and general  EBL: 70ml    Delay start of Pharmacological VTE agent (>24hrs) due to surgical blood loss or risk of bleeding:  no  DRAINS: none   SPECIMEN:  Source of Specimen:  Sigmoid colon  DISPOSITION OF SPECIMEN:  PATHOLOGY  COUNTS:  YES  PLAN OF CARE: Admit to inpatient   PATIENT DISPOSITION:  PACU - hemodynamically stable.  INDICATION:    Patient presents to the system with a partially obstructing sigmoid cancer and prostate cancer.  I recommended segmental resection of the colon and Dr Tresa Moore has recommended radical prostate resection:  The anatomy & physiology of the digestive tract was discussed.  The pathophysiology was discussed.  Natural history risks without surgery was discussed.   I worked to give an overview of the disease and the frequent need to have multispecialty involvement.  I feel the risks of no intervention will lead to serious problems that outweigh the operative risks; therefore, I recommended a partial colectomy to remove the pathology.  Laparoscopic & open techniques were discussed.   Risks such as bleeding, infection, abscess, leak, reoperation, possible ostomy, hernia, heart attack, death, and other risks were discussed.  I noted a good likelihood this will help address the problem.   Goals of post-operative recovery were discussed as well.    The patient expressed understanding & wished to proceed with surgery.  OR FINDINGS:   Patient had a large sigmoid mass that was not fixed to any surface in the  abdomen.  No obvious metastatic disease on visceral parietal peritoneum or liver.    DESCRIPTION:   Informed consent was confirmed.  The patient underwent general anaesthesia without difficulty.  The patient was positioned appropriately.  VTE prevention in place.  The patient's abdomen was clipped, prepped, & draped in a sterile fashion.  Surgical timeout confirmed our plan.  The patient was positioned in reverse Trendelenburg.  Abdominal entry was gained using Varies needle in the LUQ.  Entry was clean.  I induced carbon dioxide insufflation.  Camera inspection revealed no injury.  Extra ports were carefully placed under direct laparoscopic visualization, including a 46mm port just lateral to the umbilicus. The robot was docked to the patient's left side without difficult.  Instruments were placed under camera visualization.    I reflected the greater omentum and the upper abdomen the small bowel in the upper abdomen. I scored the base of peritoneum of the right side of the mesentery of the left colon from the ligament of Treitz to the peritoneal reflection of the mid rectum.  I elevated the sigmoid mesentery and enetered into the retro-mesenteric plane. We were able to identify the left ureter and gonadal vessels. We kept those posterior within the retroperitoneum and elevated the left colon mesentery off that. I did isolated IMA pedicle but did not ligate it yet.  I continued distally and got into the avascular plane posterior to the mesorectum. This allowed me to help mobilize the rectum as well by freeing the mesorectum off the sacrum.  I mobilized the peritoneal coverings towards the peritoneal  reflection on both the right and left sides of the rectum.  I could see the right and left ureters and stayed away from them.    I skeletonized the inferior mesenteric artery pedicle.  I went down to its takeoff from the aorta.   I isolated the inferior mesenteric vein off of the ligament of Treitz just  cephalad to that as well.  After confirming the left ureter was out of the way, I went ahead and ligated the inferior mesenteric artery pedicle with the robotic vessel sealer ~2cm above its takeoff from the aorta.   I did not ligate the inferior mesenteric vein.  We ensured hemostasis. I skeletonized the mesorectum at the junction at the proximal rectum using blunt dissection & the vessel sealer.  I mobilized the left colon in a lateral to medial fashion off the line of Toldt up towards the splenic flexure to ensure good mobilization of the left colon to reach into the pelvis.  I dissected up the mesenteric plane next to the sigmoid vessels using the robotic vessel sealer. I carried this up to the level of the colon. I then identified the rectosigmoid junction. I skeletonized the proximal rectum using the robotic vessel sealer to divide the mesentery. I then transected the rectosigmoid plane using a blue load stapler 2. We then injected 2-1/2 mL of firefly indicator intravenously. We evaluated this using the robotic firefly application. I identified a portion of distal perfusion and skeletonized the mesentery at this point.   I then transected the proximal colon, again with a blue load robotic stapler.  See picture below.   After this I turned the case over to Dr. Tresa Moore to continue with the prostate resection.  Once Dr. Tresa Moore was finished with his portion of the procedure, we identified the 2 ends of the colon. I enlarged the 12 mm port site using Bovie electrocautery. An [place] wound protector was placed. The 2 specimens were removed and sent to pathology for further examination. The proximal end of the colon was brought out through the extraction site and a 2-0 Prolene pursestring suture was placed on the open end of the colon.  This was secured with interrupted 3-0 silk sutures.   A 33 mm EEA anvil was placed and the pursestring suture was tied tightly around this. This was then placed back into the  abdomen and the cap was placed on the wound protector. The abdomen was reinsufflated and an anastomosis was created from the distal rectal stump to the proximal colon using laparoscopic technique. The anastomosis was without tension. There was no leak when insufflated under water. The pelvis was irrigated. A drain was placed into the pelvis and brought out through the left lower quadrant port site. This was secured into place with a 2-0 nylon suture. We then removed all dirty instrumentation and proceeded with a clean portion of the closure.   The fascia of the extraction site was closed using interrupted #1 Novafil sutures. The subcutaneous tissue was closed using interrupted 2-0 Vicryl sutures. The skin of all port sites was closed using a running 4-0 Monocryl or 4-0 Vicryl suture. Dermabond was placed over the port sites. A sterile dressing was placed over the extraction site. The patient was then awakened from anesthesia and sent to the postanesthesia care unit in stable condition. All counts were correct per operating room staff.

## 2014-11-07 NOTE — H&P (Signed)
[name] is an 64 y.o. male.    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 CT chest/abd/pelvis without any obvious locally advanced or distant disease. Bone scan w/o obvious metastatic disease.   2 - Erectile Dysfunciton - On PDE5i for moderate ED. Able to penetrate some of the time w/o meds.  3- Urinary Frequency, Weak Stream - On tamsulosin for mild-moderate obstructive symptoms with improvement. No incontinence.  PMH sig for colon cancer (newly diagnosed, to undergo partial colectomy by Dr. TMarcello Moores, HTN. NO CV disease. NO strong blood thinners. His PCP is Dibas Koirala MD    Today "[name] is seen to proceed with robotic prostatectomy at time of colon resection in combined gen surg urology procedure.   Past Medical History  Diagnosis Date  . Hypertension   . Asthma     childhood; also triggered around certain molds  . Shortness of breath dyspnea     walking distances or climbing stairs   . GERD (gastroesophageal reflux disease)   . Seizures     hx of 10 to 15 years ago while at work occurred just one pt saw neurologist once no further followup   . Arthritis   . Cancer     prostate and colon   . Trauma to eye, left     has artificial eye accident related to lawnmower     Past Surgical History  Procedure Laterality Date  . Mouth surgery      upper   . Colonscopy     . Eye surgery      No family history on file. Social History:  reports that he has never smoked. He has never used smokeless tobacco. He reports that he drinks alcohol. He reports that he uses illicit drugs (Cocaine and Marijuana).  Allergies: No Known Allergies  Medications Prior to Admission  Medication Sig Dispense Refill  . lisinopril (PRINIVIL,ZESTRIL) 20 MG tablet Take 20  mg by mouth daily.    . indomethacin (INDOCIN) 25 MG capsule Take 25 mg by mouth 2 times daily at 12 noon and 4 pm. As needed for gout flare up    . predniSONE (DELTASONE) 5 MG tablet Take 5 mg by mouth daily. Pt takes if takes indomethecin. When pt takes will take up to 20 mgs once then will not continue a tapering.    . tamsulosin (FLOMAX) 0.4 MG CAPS capsule Take 0.4 mg by mouth daily.      Results for orders placed or performed during the hospital encounter of 11/06/14 (from the past 48 hour(s))  CBC     Status: Abnormal   Collection Time: 11/06/14  8:45 AM  Result Value Ref Range   WBC 6.9 4.0 - 10.5 K/uL   RBC 4.50 4.22 - 5.81 MIL/uL   Hemoglobin 11.5 (L) 13.0 - 17.0 g/dL   HCT 37.4 (L) 39.0 - 52.0 %   MCV 83.1 78.0 - 100.0 fL   MCH 25.6 (L) 26.0 - 34.0 pg   MCHC 30.7 30.0 - 36.0 g/dL   RDW 16.6 (H) 11.5 - 15.5 %   Platelets 306 150 - 400 K/uL  Basic metabolic panel     Status: None   Collection Time: 11/06/14  8:45 AM  Result Value Ref Range  Sodium 137 135 - 145 mmol/L   Potassium 4.1 3.5 - 5.1 mmol/L   Chloride 105 96 - 112 mmol/L   CO2 25 19 - 32 mmol/L   Glucose, Bld 91 70 - 99 mg/dL   BUN 7 6 - 23 mg/dL   Creatinine, Ser 0.70 0.50 - 1.35 mg/dL   Calcium 8.7 8.4 - 10.5 mg/dL   GFR calc non Af Amer >90 >90 mL/min   GFR calc Af Amer >90 >90 mL/min    Comment: (NOTE) The eGFR has been calculated using the CKD EPI equation. This calculation has not been validated in all clinical situations. eGFR's persistently <90 mL/min signify possible Chronic Kidney Disease.    Anion gap 7 5 - 15  Type and screen     Status: None   Collection Time: 11/06/14  8:45 AM  Result Value Ref Range   ABO/RH(D) O POS    Antibody Screen NEG    Sample Expiration 11/10/2014   ABO/Rh     Status: None   Collection Time: 11/06/14  8:45 AM  Result Value Ref Range   ABO/RH(D) O POS    No results found.  Review of Systems  Constitutional: Negative.   HENT: Negative.   Eyes: Negative.    Respiratory: Negative.   Cardiovascular: Negative.   Gastrointestinal: Negative.   Genitourinary: Negative.   Musculoskeletal: Negative.   Skin: Negative.   Neurological: Negative.   Endo/Heme/Allergies: Negative.   Psychiatric/Behavioral: Negative.     Blood pressure 140/89, pulse 91, temperature 97.9 F (36.6 C), temperature source Oral, resp. rate 18, height 5' 9"  (1.753 m), weight 83.462 kg (184 lb), SpO2 98 %. Physical Exam  Constitutional: He appears well-developed.  HENT:  Head: Normocephalic.  Eyes: Pupils are equal, round, and reactive to light.  Neck: Normal range of motion.  Cardiovascular: Normal rate.   Respiratory: Effort normal.  GI: Soft.  Genitourinary: Penis normal.  Musculoskeletal: Normal range of motion.  Neurological: He is alert.  Skin: Skin is warm.  Psychiatric: He has a normal mood and affect. His behavior is normal. Judgment and thought content normal.     Assessment/Plan  1 - High Risk Prostate Cancer - very aggressive disease. Dr. Marcello Moores' team and out team have planned extensively for combined surgery with tetntative plan for Gen surg to perform extirpative poritons, Urol to then perform prostattecotmy, and then finally perform colon re-anastamosis after GU tract continuite re-established.   We rediscussed prostatectomy and specifically robotic prostatectomy with bilateral pelvic lymphadenectomy being the technique that I most commonly perform. I showed the patient on their abdomen the approximately 6 small incision (trocar) sites as well as presumed extraction sites with robotic approach as well as possible open incision sites should open conversion be necessary. We rediscussed peri-operative risks including bleeding, infection, deep vein thrombosis, pulmonary embolism, compartment syndrome, nuropathy / neuropraxia, heart attack, stroke, death, as well as long-term risks such as non-cure / need for additional therapy. We specifically readdressed that  the procedure would compromise urinary control leading to stress incontinence which typically resolves with time and pelvic rehabilitation (Kegel's, etc..), but can sometimes be permanent and require additional therapy including surgery. We also specifically readdressed sexual sequellae including significant erectile dysfunction which typically partially resolves with time but can also be permanent and require additional therapy including surgery.   We rediscussed the typical hospital course, discharge with foley catheter in place usually for 1-2 weeks before voiding trial as well as usually 2 week recovery until able to  perform most non-strenuous activity and 6 weeks until able to return to most jobs and more strenuous activity such as exercise.   2 - Erectile Dysfunciton - reitterated that this would be significantly worse after any primary therapy for high-risk prostate cancer likely requiring more aggressive therapy than PO meds alone.   3- Urinary Frequency, Weak Stream - fortunately gland size not huge, I am concerned that his obstructive symptoms are malignant in origin.    Lamir Racca 11/07/2014, 6:49 AM

## 2014-11-07 NOTE — Anesthesia Preprocedure Evaluation (Signed)
Anesthesia Evaluation  Patient identified by MRN, date of birth, ID band Patient awake    Reviewed: Allergy & Precautions, NPO status , Patient's Chart, lab work & pertinent test results  Airway Mallampati: II  TM Distance: >3 FB Neck ROM: Full    Dental no notable dental hx.    Pulmonary neg pulmonary ROS,  breath sounds clear to auscultation  Pulmonary exam normal       Cardiovascular hypertension, Pt. on medications Rhythm:Regular Rate:Normal     Neuro/Psych negative neurological ROS  negative psych ROS   GI/Hepatic negative GI ROS, Neg liver ROS, GERD-  ,  Endo/Other  negative endocrine ROS  Renal/GU negative Renal ROS  negative genitourinary   Musculoskeletal negative musculoskeletal ROS (+)   Abdominal   Peds negative pediatric ROS (+)  Hematology negative hematology ROS (+)   Anesthesia Other Findings   Reproductive/Obstetrics negative OB ROS                             Anesthesia Physical Anesthesia Plan  ASA: II  Anesthesia Plan: General   Post-op Pain Management:    Induction: Intravenous  Airway Management Planned: Oral ETT  Additional Equipment: Arterial line  Intra-op Plan:   Post-operative Plan: Extubation in OR  Informed Consent: I have reviewed the patients History and Physical, chart, labs and discussed the procedure including the risks, benefits and alternatives for the proposed anesthesia with the patient or authorized representative who has indicated his/her understanding and acceptance.   Dental advisory given  Plan Discussed with: CRNA and Surgeon  Anesthesia Plan Comments:         Anesthesia Quick Evaluation

## 2014-11-07 NOTE — H&P (View-Only) (Signed)
[name] 10/16/2014 9:41 AM Location: [facility] Patient #: [id number] DOB: [birth year] Single / Language: [name] / Race: White Male History of Present Illness Charles Ruff MD; 4/0/9735 10:09 AM) Patient words: colon mass.  The patient is a 64 year old male who presents with colorectal cancer. This is a 64 year old male who presents to my office with a newly diagnosed sigmoid colon cancer. He underwent a colonoscopy with Dr. Watt Thornton for screening purposes. Of note he has been having rectal bleeding for several months. He denies any weight loss. He has also had changes in his bowel habits which have resulted in crampy abdominal pain and chronic diarrhea. He denies any chest pain with exertion. He has no cardiac history. He is a nonsmoker. He has 1 sister that was diagnosed with colon cancer. Other Problems [name], CMA; 10/16/2014 9:41 AM) Arthritis Asthma Colon Cancer Gastroesophageal Reflux Disease High blood pressure Prostate Cancer  Past Surgical History [name], CMA; 10/16/2014 9:41 AM) Oral Surgery  Medication History [name], CMA; 10/16/2014 9:43 AM) Lisinopril (20MG  Tablet, Oral) Active.  Social History [name], CMA; 10/16/2014 9:41 AM) Alcohol use Moderate alcohol use. Caffeine use Coffee. Illicit drug use Uses socially only. Tobacco use Never smoker.  Family History [name], [name]; 10/16/2014 9:41 AM) Alcohol Abuse Sister. Arthritis Father. Colon Cancer Father, Sister. Diabetes Mellitus Mother. Hypertension Sister. Ischemic Bowel Disease Sister. Prostate Cancer Father.     Review of Systems ([name]; 10/16/2014 9:41 AM) General Present- Appetite Loss, Fatigue and Night Sweats. Not Present- Chills, Fever, Weight Gain and Weight Loss. Skin Not Present- Change in Wart/Mole, Dryness, Hives, Jaundice, New Lesions, Non-Healing Wounds, Rash and Ulcer. HEENT Present- Seasonal Allergies and Wears  glasses/contact lenses. Not Present- Earache, Hearing Loss, Hoarseness, Nose Bleed, Oral Ulcers, Ringing in the Ears, Sinus Pain, Sore Throat, Visual Disturbances and Yellow Eyes. Respiratory Present- Snoring. Not Present- Bloody sputum, Chronic Cough, Difficulty Breathing and Wheezing. Cardiovascular Not Present- Chest Pain, Difficulty Breathing Lying Down, Leg Cramps, Palpitations, Rapid Heart Rate, Shortness of Breath and Swelling of Extremities. Gastrointestinal Present- Abdominal Pain, Excessive gas, Gets full quickly at meals and Indigestion. Not Present- Bloating, Bloody Stool, Change in Bowel Habits, Chronic diarrhea, Constipation, Difficulty Swallowing, Hemorrhoids, Nausea, Rectal Pain and Vomiting. Male Genitourinary Present- Change in Urinary Stream, Frequency and Nocturia. Not Present- Blood in Urine, Impotence, Painful Urination, Urgency and Urine Leakage.  Vitals ([name] CMA; 10/16/2014 9:43 AM) 10/16/2014 9:42 AM Weight: 186 lb Height: 69in Body Surface Area: 2.03 m Body Mass Index: 27.47 kg/m Temp.: 38F(Temporal)  Pulse: 80 (Regular)  BP: 116/70 (Sitting, Left Arm, Standard)     Physical Exam Charles Ruff MD; 11/07/9922 10:10 AM)  General Mental Status-Alert. General Appearance-Consistent with stated age. Hydration-Well hydrated. Voice-Normal.  Head and Neck Head-normocephalic, atraumatic with no lesions or palpable masses. Trachea-midline. Thyroid Gland Characteristics - normal size and consistency.  Eye Eyeball - Bilateral-Extraocular movements intact. Sclera/Conjunctiva - Bilateral-No scleral icterus.  Chest and Lung Exam Chest and lung exam reveals -quiet, even and easy respiratory effort with no use of accessory muscles and on auscultation, normal breath sounds, no adventitious sounds and normal vocal resonance. Inspection Chest Wall - Normal. Back - normal.  Cardiovascular Cardiovascular examination reveals -normal  heart sounds, regular rate and rhythm with no murmurs and normal pedal pulses bilaterally.  Abdomen Inspection Inspection of the abdomen reveals - No Hernias. Palpation/Percussion Palpation and Percussion of the abdomen reveal - Soft, Non Tender, No Rebound tenderness, No Rigidity (guarding) and  No hepatosplenomegaly. Auscultation Auscultation of the abdomen reveals - Bowel sounds normal.  Neurologic Neurologic evaluation reveals -alert and oriented x 3 with no impairment of recent or remote memory. Mental Status-Normal.  Musculoskeletal Global Assessment -Note:no gross deformities.  Normal Exam - Left-Upper Extremity Strength Normal and Lower Extremity Strength Normal. Normal Exam - Right-Upper Extremity Strength Normal and Lower Extremity Strength Normal.    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 This appeared partially obstructive. CT scan shows a large sigmoid mass that does not appear to be invading any external structures. There is no signs of metastatic disease. There is a lesion in the right middle lobe and is felt to be benign. We will complete his cancer workup by obtaining a CT scan of the chest and a CEA level. We will then schedule him for robotic sigmoidectomy. I will send this note to Dr. Dorina Thornton as well given his recent diagnosis of prostate cancer.

## 2014-11-08 LAB — CBC
HCT: 33.9 % — ABNORMAL LOW (ref 39.0–52.0)
Hemoglobin: 10.6 g/dL — ABNORMAL LOW (ref 13.0–17.0)
MCH: 25.5 pg — ABNORMAL LOW (ref 26.0–34.0)
MCHC: 31.3 g/dL (ref 30.0–36.0)
MCV: 81.7 fL (ref 78.0–100.0)
PLATELETS: 267 10*3/uL (ref 150–400)
RBC: 4.15 MIL/uL — ABNORMAL LOW (ref 4.22–5.81)
RDW: 16.8 % — ABNORMAL HIGH (ref 11.5–15.5)
WBC: 10.5 10*3/uL (ref 4.0–10.5)

## 2014-11-08 LAB — CREATININE, FLUID (PLEURAL, PERITONEAL, JP DRAINAGE): CREAT FL: 1 mg/dL

## 2014-11-08 LAB — BASIC METABOLIC PANEL
Anion gap: 5 (ref 5–15)
BUN: 11 mg/dL (ref 6–23)
CALCIUM: 8 mg/dL — AB (ref 8.4–10.5)
CO2: 26 mmol/L (ref 19–32)
CREATININE: 0.86 mg/dL (ref 0.50–1.35)
Chloride: 103 mmol/L (ref 96–112)
GFR, EST NON AFRICAN AMERICAN: 89 mL/min — AB (ref >90)
Glucose, Bld: 178 mg/dL — ABNORMAL HIGH (ref 70–99)
Potassium: 4.2 mmol/L (ref 3.5–5.1)
Sodium: 134 mmol/L — ABNORMAL LOW (ref 135–145)

## 2014-11-08 MED ORDER — LISINOPRIL 20 MG PO TABS
20.0000 mg | ORAL_TABLET | Freq: Every day | ORAL | Status: DC
  Administered 2014-11-08 – 2014-11-11 (×4): 20 mg via ORAL
  Filled 2014-11-08 (×5): qty 1

## 2014-11-08 MED ORDER — ALVIMOPAN 12 MG PO CAPS
12.0000 mg | ORAL_CAPSULE | Freq: Two times a day (BID) | ORAL | Status: DC
  Administered 2014-11-08 – 2014-11-09 (×3): 12 mg via ORAL
  Filled 2014-11-08 (×4): qty 1

## 2014-11-08 NOTE — Progress Notes (Signed)
1 Day Post-Op Robotic Assisted Sigmoidectomy and Prostatectomy  Subjective: Did well overnight.  Pain controlled.  No nausea.    Objective: Vital signs in last 24 hours: Temp:  [97.5 F (36.4 C)-99.3 F (37.4 C)] 97.5 F (36.4 C) (03/03 0513) Pulse Rate:  [60-99] 60 (03/03 0513) Resp:  [9-18] 14 (03/03 0655) BP: (143-226)/(67-148) 145/73 mmHg (03/03 0513) SpO2:  [98 %-100 %] 100 % (03/03 0655) Arterial Line BP: (191-223)/(72-105) 191/72 mmHg (03/02 1615) FiO2 (%):  [42 %] 42 % (03/03 0655)   Intake/Output from previous day: 03/02 0701 - 03/03 0700 In: 3201.7 [I.V.:2651.7; IV Piggyback:550] Out: 546 [Urine:625; Drains:160] Intake/Output this shift:     General appearance: alert and cooperative GI: normal findings: soft, non-tender  Incision: no significant drainage  Lab Results:   Recent Labs  11/06/14 0845 11/08/14 0423  WBC 6.9 10.5  HGB 11.5* 10.6*  HCT 37.4* 33.9*  PLT 306 267   BMET  Recent Labs  11/06/14 0845 11/08/14 0423  NA 137 134*  K 4.1 4.2  CL 105 103  CO2 25 26  GLUCOSE 91 178*  BUN 7 11  CREATININE 0.70 0.86  CALCIUM 8.7 8.0*   PT/INR No results for input(s): LABPROT, INR in the last 72 hours. ABG No results for input(s): PHART, HCO3 in the last 72 hours.  Invalid input(s): PCO2, PO2  MEDS, Scheduled . acetaminophen  1,000 mg Oral 4 times per day  . cefoTEtan (CEFOTAN) 2 GM IVPB  2 g Intravenous Q12H  . enoxaparin (LOVENOX) injection  40 mg Subcutaneous Q24H  . morphine   Intravenous 6 times per day    Studies/Results: No results found.  Assessment: s/p Procedure(s): XI ROBOT ASSISTED LAPAROSCOPIC PARTIAL COLECTOMY XI ROBOTIC ASSISTED LAPAROSCOPIC RADICAL PROSTATECTOMY WITH NODES PROCTOSCOPY Patient Active Problem List   Diagnosis Date Noted  . Colon cancer 11/07/2014    Expected post op course  Plan: Advance diet to clears Cont foley due to prostate surgery OOB and ambulate today Cont PCA JP management per Dr  Tresa Moore   LOS: 1 day     .Rosario Adie, [facility], [place] [phone]   11/08/2014 7:27 AM

## 2014-11-08 NOTE — Op Note (Signed)
[name], [name]              ACCOUNT NO.:  [id number]  MEDICAL RECORD NO.:  [id number]  LOCATION:  4970                         FACILITY:  [facility]  PHYSICIAN:  Alexis Frock, MD     DATE OF BIRTH:  10-Feb-1951  DATE OF PROCEDURE: 11/07/2014                               OPERATIVE REPORT   DIAGNOSIS:  High risk prostate cancer.  PROCEDURE: 1. Robotic assisted laparoscopic radical prostatectomy. 2. Injection of indocyanine green dye for sentinel lymphangiography. 3. Bilateral pelvic lymph node dissection .  ESTIMATED BLOOD LOSS:  50 mL.  COMPLICATIONS:  None.  SPECIMEN: 1. Periprostatic fat. 2. Radical prostatectomy. 3. Right external iliac lymph nodes. 4. Right obturator lymph nodes. 5. Left external iliac lymph nodes. 6. Left obturator lymph nodes.  ASSISTANT:  Clemetine Marker, PA  DRAINS: 1. Jackson-Pratt drain with suction. 2. Foley catheter straight drain.  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  He has been counseled extensively about the management options for these separate lesions by General Surgery team as well as multiple urologist including myself and colleague, Dr. Diona Fanti, and he has considered options including palliative approaches versus ablative therapies versus surgical extirpation with and without minimally invasive assistance.  Dr. Marcello Moores is his general surgeon, and both agreed that it would certainly be feasible to perform combined robotic prostatectomy with robotic sigmoid resection and primary anastomosis. This option was also presented to the patient and he wished to proceed with the latter.  Informed consent was obtained and placed in medical record.  PROCEDURE IN DETAIL:  Dr. Marcello Moores had already performed the extiorpative portions of colon resection at  which point, we were called to the room to begin the radical prostatectomy.  The patient already had general anesthesia, was currently on his antibiotics and was in a low lithotomy position with multiple port sites across his right hemi-abdomen and at the umbilicus.  It was felt that this positioning would likely also work very well for prostatectomy and the two additional ports were placed including a left far alteral 8-mm robotic port site and the right paramedian 5 mm assitant port site, the previous 12 mm robotic stapler trocar was exchanged for a 12 mm traditional laparoscopic trocar.  The patient was repositioned into 24 degrees of treatment OR position, found to be suitably positioned on the table.  Pneumoperitoneum was re- established.  The pelvis was inspected.  There was excellent hemostasis. No obvious advanced disease and the sigmoid stump was clearly seen in situ.  The patient was then readied for robotic prostatectomy.  Initial step being development of the space of Retzius incision made lateral to the left medial umbilical ligament from midline towards the area of the internal ring coursing along the iliac vessels towards the area of the ureteral crossing which was positively identified, the left lateral wall was then carefully swept away from the pelvic sidewall towards the area of the endopelvic fascia.  A mirror image dissection was performed on the right side.  During these maneuvers, the bilateral vas deferens were purposely  ligated and using bucket-handle for medial traction, with this anterior superior attachments of the bladder were then released, felt some space of Retzius, this allowed exposure of the anterior base of the prostate.  The area of the prostate bladder junction was carefully defatted with his fatty tissue set aside for permanent pathology labeled periprostatic fat.  Next, a 0.2 mL of indocyanine green dye was injected into each lobe of the prostate  under robotic guidance using an 18-gauge Chiba needle placed in the suprapubic position.  There was intervening suction and aspiration and coagulation of the injection point of the prostate, operative dye spillage which did not occur.  Next, the endopelvic fascia was swept away from the lateral aspect of the prostate in a based apex orientation, this exposed the dorsal venous complex, which was controlled using vascular stapler and then approximately 15 minutes post dye injection in the pelvis was injected under near-infrared fluorescent light.  There was clearly seen some lymphatic channels coursing along the anterior lateral aspect of the prostate, though however no obvious sentinel lymph nodes within the traditional external iliac obturator packets or in the internal iliac, common iliac, or aortic bifurcation area bilaterally.  As such standard template lymphadenectomy was performed first on the right side with external iliac lymph node over the confines being the external iliac arteries and pelvic side wall iliac crossing.  Lymphostasis was achieved with cold clips, was labeled as right external iliac lymph nodes.  Next, the right obturator group was carefully mobilized, the boundaries being obturator nerve, pelvic side wall, and external iliac vein.  Lymphostasis was again achieved with cold clips.  The obturator nerve was carefully inspected during these maneuvers and remained uninjured.  The right obturator lymph node was then set aside for permanent pathology and mirror image lymphadenectomy was performed on the left side.  Again the left obturator and left external iliac lymph nodes and left obturator nerve was inspected following these maneuvers and found to be completely intact.  Next, the bladder neck was identified in the anterior plane moving the Foley catheter back and forth and the bladder neck was carefully separated anterior-posterior direction avoiding excessive caliber  bladder neck.  Posterior dissection was performed by incising approximately 7 mm inferior posterior to the posterior lip of the bladder neck and dissecting towards Denonvilliers fascia.  Bilateral seminal vesicles were encountered, dissected for distance of 4 cm, ligated, and placed on gentle superior traction.  Bilateral seminal vesicles were dissected to the tip and also placed on gentle superior traction.  This exposed the posterior plane of the prostate which was carefully dissected anterior to Denonvilliers fascia in a base to apex orientation.  This exposed the vascular pedicles of the prostate which were then sequentially controlled using a clipping technique purposely performing wide resection of these structures given the patient's high risk disease.  Apical dissection was performed in the anterior plane, placing the prostate in gentle superior traction identifying the membranous urethra and transecting coldly, this completely freed up the prostatectomy specimen which was placed into an EndoCatch bag for later retrieval.  Digital rectal exam was performed using indicator glove under robotic vision and no metal disruption was noted.  Posterior reconstruction was performed using a single V-Loc suture reapproximating the posterior urethral plate to the posterior bladder neck which brought these structures in a tension-free apposition.  Next, mucosa-to-mucosa anastomosis was performed in the bladder neck and membranous urethral stump using double-armed V-Loc suture from the 6 o'clock to 12 o'clock position resulted  in excellent membranous urethral apposition.  This suture was also anchored to the puboprostatic ligament anteriorly, thus performing anterior repair as well.  The Foley catheter was placed per urethra which irrigated quantitatively without obvious leaks.  As the patient still required bowel anastomosis and better visualization of the sigmoid again, the space of Retzius was  completely re-approximated using running V-Loc suture along the previous anterior peritoneal incisions, was completely reapproximated these structures and redeveloped space of Retzius.  Very careful reapproximation was performed to avoid peritoneal defects which may allow for internal hernias.  No such defects were seen.  At this point, the procedure was once again turned over to Dr. Marcello Moores as primary surgeon for the bowel anastomoses portions after which a closed suction drain was brought through the previous right lateral most robotic port site in the area of the peritoneal cavity and all specimens retrieved and set aside for Pathology.  The patient was taken to postanesthesia care unit in stable condition.          ______________________________ Alexis Frock, MD     TM/MEDQ  D:  11/07/2014  T:  11/08/2014  Job:  [id number]

## 2014-11-08 NOTE — Progress Notes (Signed)
1 Day Post-Op  Subjective:  1 - High Risk Prostate Cancer - s/p robotic radical prostatecotmy with bilateral ICG sentinal + template lymphadenecotmy on 3/2, the day of admission without acute complicaiton. JP Cr POD 1.0 (same as serum) His surger was combined with partial colectomy and re-anastamois by Dr. Marcello Moores.   Today Winferd is w/o complaints. Pain controlled. NO emesis.   Objective: Vital signs in last 24 hours: Temp:  [97.5 F (36.4 C)-99.3 F (37.4 C)] 98.2 F (36.8 C) (03/03 0846) Pulse Rate:  [60-99] 61 (03/03 0846) Resp:  [9-18] 11 (03/03 1152) BP: (143-226)/(67-148) 150/74 mmHg (03/03 0846) SpO2:  [98 %-100 %] 98 % (03/03 1152) Arterial Line BP: (191-223)/(72-105) 191/72 mmHg (03/02 1615) FiO2 (%):  [42 %] 42 % (03/03 0655) Last BM Date: 11/07/14  Intake/Output from previous day: 03/02 0701 - 03/03 0700 In: 3945 [I.V.:3395; IV Piggyback:550] Out: 785 [Urine:625; Drains:160] Intake/Output this shift: Total I/O In: 240 [P.O.:240] Out: 150 [Urine:150]  General appearance: alert, cooperative, appears stated age and family at bedside Head: Normocephalic, without obvious abnormality, atraumatic Nose: Nares normal. Septum midline. Mucosa normal. No drainage or sinus tenderness. Throat: lips, mucosa, and tongue normal; teeth and gums normal Neck: supple, symmetrical, trachea midline Back: symmetric, no curvature. ROM normal. No CVA tenderness. Resp: non-labored on [place] O2 Cardio: Nl rate GI: soft, non-tender; bowel sounds normal; no masses,  no organomegaly Male genitalia: normal, foley c/d/i with grossly clear urine Extremities: extremities normal, atraumatic, no cyanosis or edema Pulses: 2+ and symmetric Skin: Skin color, texture, turgor normal. No rashes or lesions Lymph nodes: Cervical, supraclavicular, and axillary nodes normal. Neurologic: Grossly normal Incision/Wound: recent port sites and extraction site c/d/i. JP with serosanguinous output.   Lab Results:    Recent Labs  11/06/14 0845 11/08/14 0423  WBC 6.9 10.5  HGB 11.5* 10.6*  HCT 37.4* 33.9*  PLT 306 267   BMET  Recent Labs  11/06/14 0845 11/08/14 0423  NA 137 134*  K 4.1 4.2  CL 105 103  CO2 25 26  GLUCOSE 91 178*  BUN 7 11  CREATININE 0.70 0.86  CALCIUM 8.7 8.0*   PT/INR No results for input(s): LABPROT, INR in the last 72 hours. ABG No results for input(s): PHART, HCO3 in the last 72 hours.  Invalid input(s): PCO2, PO2  Studies/Results: No results found.  Anti-infectives: Anti-infectives    Start     Dose/Rate Route Frequency Ordered Stop   11/07/14 2200  cefoTEtan (CEFOTAN) 2 g in dextrose 5 % 50 mL IVPB  Status:  Discontinued     2 g 100 mL/hr over 30 Minutes Intravenous Every 12 hours 11/07/14 1645 11/07/14 1717   11/07/14 1430  cefoTEtan (CEFOTAN) 2 g in dextrose 5 % 50 mL IVPB     2 g 100 mL/hr over 30 Minutes Intravenous Every 12 hours 11/07/14 1415     11/07/14 0636  cefoTEtan (CEFOTAN) 2 g in dextrose 5 % 50 mL IVPB     2 g 100 mL/hr over 30 Minutes Intravenous On call to O.R. 11/07/14 1962 11/07/14 0842   11/07/14 0000  ciprofloxacin (CIPRO) 500 MG tablet     500 mg Oral 2 times daily 11/07/14 1352        Assessment/Plan:  1 - High Risk Prostate Cancer - doing very well from GU perspective POD1. Will remove JP later today or tomorrow. Catheter to stay in at discharge and he has f/u arranged and on the chart as part of follow-up instructions.  Will follow.   [facility], Wayne Brunker 11/08/2014

## 2014-11-09 ENCOUNTER — Encounter (HOSPITAL_COMMUNITY): Payer: Self-pay | Admitting: General Surgery

## 2014-11-09 LAB — CBC
HCT: 32.4 % — ABNORMAL LOW (ref 39.0–52.0)
HEMOGLOBIN: 10.1 g/dL — AB (ref 13.0–17.0)
MCH: 26 pg (ref 26.0–34.0)
MCHC: 31.2 g/dL (ref 30.0–36.0)
MCV: 83.3 fL (ref 78.0–100.0)
Platelets: 275 10*3/uL (ref 150–400)
RBC: 3.89 MIL/uL — AB (ref 4.22–5.81)
RDW: 17.1 % — ABNORMAL HIGH (ref 11.5–15.5)
WBC: 9.2 10*3/uL (ref 4.0–10.5)

## 2014-11-09 LAB — BASIC METABOLIC PANEL
Anion gap: 5 (ref 5–15)
BUN: 6 mg/dL (ref 6–23)
CO2: 31 mmol/L (ref 19–32)
Calcium: 8 mg/dL — ABNORMAL LOW (ref 8.4–10.5)
Chloride: 101 mmol/L (ref 96–112)
Creatinine, Ser: 0.8 mg/dL (ref 0.50–1.35)
GFR calc Af Amer: >90 mL/min (ref >90)
GLUCOSE: 119 mg/dL — AB (ref 70–99)
Potassium: 3.7 mmol/L (ref 3.5–5.1)
Sodium: 137 mmol/L (ref 135–145)

## 2014-11-09 MED ORDER — MORPHINE SULFATE 2 MG/ML IJ SOLN: 2.0000 mg | INTRAMUSCULAR | Status: DC | PRN

## 2014-11-09 MED ORDER — OXYCODONE-ACETAMINOPHEN 5-325 MG PO TABS
1.0000 | ORAL_TABLET | ORAL | Status: DC | PRN
  Administered 2014-11-09 – 2014-11-11 (×8): 2 via ORAL
  Filled 2014-11-09 (×9): qty 2

## 2014-11-09 NOTE — Progress Notes (Signed)
2 Days Post-Op Robotic Assisted Sigmoidectomy and Prostatectomy  Subjective: Did well yesterday.  Ambulated twice.  Pain controlled.  No nausea.  Passing flatus.   Objective: Vital signs in last 24 hours: Temp:  [98.2 F (36.8 C)-99.3 F (37.4 C)] 98.4 F (36.9 C) (03/04 0550) Pulse Rate:  [61-75] 72 (03/04 0550) Resp:  [11-19] 12 (03/04 0550) BP: (121-176)/(69-85) 121/69 mmHg (03/04 0550) SpO2:  [97 %-100 %] 98 % (03/04 0550)   Intake/Output from previous day: 03/03 0701 - 03/04 0700 In: 2745.8 [P.O.:840; I.V.:1905.8] Out: 2770 [Urine:2680; Drains:90] Intake/Output this shift:   General appearance: alert and cooperative GI: normal findings: soft, non-tender  Incision: no significant drainage  Lab Results:   Recent Labs  11/08/14 0423 11/09/14 0459  WBC 10.5 9.2  HGB 10.6* 10.1*  HCT 33.9* 32.4*  PLT 267 275   BMET  Recent Labs  11/08/14 0423 11/09/14 0459  NA 134* 137  K 4.2 3.7  CL 103 101  CO2 26 31  GLUCOSE 178* 119*  BUN 11 6  CREATININE 0.86 0.80  CALCIUM 8.0* 8.0*   PT/INR No results for input(s): LABPROT, INR in the last 72 hours. ABG No results for input(s): PHART, HCO3 in the last 72 hours.  Invalid input(s): PCO2, PO2  MEDS, Scheduled . alvimopan  12 mg Oral BID  . enoxaparin (LOVENOX) injection  40 mg Subcutaneous Q24H  . lisinopril  20 mg Oral Daily    Studies/Results: No results found.  Assessment: s/p Procedure(s): XI ROBOT ASSISTED LAPAROSCOPIC PARTIAL COLECTOMY XI ROBOTIC ASSISTED LAPAROSCOPIC RADICAL PROSTATECTOMY WITH NODES PROCTOSCOPY Patient Active Problem List   Diagnosis Date Noted  . Colon cancer 11/07/2014    Expected post op course  Plan: Advance diet to full liquids Cont foley due to prostate surgery OOB and ambulate more today D/C PCA, PO pain meds, IV for breakthrough JP management per Dr Tresa Moore   LOS: 2 days     .Rosario Adie, [facility], [place]   11/09/2014 7:47  AM

## 2014-11-09 NOTE — Discharge Instructions (Addendum)
For your Prostate Surgery:  1. Activity:  You are encouraged to ambulate frequently (about every hour during waking hours) to help prevent blood clots from forming in your legs or lungs.  However, you should not engage in any heavy lifting (> 10-15 lbs), strenuous activity, or straining. 2. Diet: You should continue a clear liquid diet until passing gas from below.  Once this occurs, you may advance your diet to a soft diet that would be easy to digest (i.e soups, scrambled eggs, mashed potatoes, etc.) for 24 hours just as you would if getting over a bad stomach flu.  If tolerating this diet well for 24 hours, you may then begin eating regular food.  It will be normal to have some amount of bloating, nausea, and abdominal discomfort intermittently. 3. Prescriptions:  You will be provided a prescription for pain medication to take as needed.  If your pain is not severe enough to require the prescription pain medication, you may take Tylenol instead.  You should also take an over the counter stool softener (Colace 100 mg twice daily) to avoid straining with bowel movements as the pain medication may constipate you. Finally, you will also be provided a prescription for an antibiotic to begin the day prior to your return visit in the office for catheter removal. 4. Catheter care: You will be taught how to take care of the catheter by the nursing staff prior to discharge from the hospital.  You may use both a leg bag and the larger bedside bag but it is recommended to at least use the bigger bedside bag at nighttime as the leg bag is small and will fill up overnight and also does not drain as well when lying flat. You may periodically feel a strong urge to void with the catheter in place.  This is a bladder spasm and most often can occur when having a bowel movement or when you are moving around. It is typically self-limited and usually will stop after a few minutes.  You may use some Vaseline or Neosporin around  the tip of the catheter to reduce friction at the tip of the penis. 5. Incisions:  You may start showering (not soaking or bathing in water) 48 hours after surgery and the incisions simply need to be patted dry after the shower.  No additional care is needed. 6. What to call us about: You should call the office [phone]) if you develop fever > 101, persistent vomiting, or the catheter stops draining. Also, feel free to call with any other questions you may have and remember the handout that was provided to you as a reference preoperatively which answers many of the common questions that arise after surgery. 7. You may resume aspirin, advil, aleve, vitamins, and supplements 7 days after surgery.    ABDOMINAL SURGERY: POST OP INSTRUCTIONS  1. DIET: Follow a light bland diet the first 24 hours after arrival home, such as soup, liquids, crackers, etc.  Be sure to include lots of fluids daily.  Avoid fast food or heavy meals as your are more likely to get nauseated.  Do not eat any uncooked fruits or vegetables for the next 2 weeks as your colon heals. 2. Take your usually prescribed home medications unless otherwise directed. 3. PAIN CONTROL: a. Pain is best controlled by a usual combination of three different methods TOGETHER: i. Ice/Heat ii. Over the counter pain medication iii. Prescription pain medication b. Most patients will experience some swelling and bruising around the  incisions.  Ice packs or heating pads (30-60 minutes up to 6 times a day) will help. Use ice for the first few days to help decrease swelling and bruising, then switch to heat to help relax tight/sore spots and speed recovery.  Some people prefer to use ice alone, heat alone, alternating between ice & heat.  Experiment to what works for you.  Swelling and bruising can take several weeks to resolve.   c. It is helpful to take an over-the-counter pain medication regularly for the first few weeks.  Choose one of the following  that works best for you: i. Naproxen (Aleve, etc)  Two 220mg  tabs twice a day ii. Ibuprofen (Advil, etc) Three 200mg  tabs four times a day (every meal & bedtime) iii. Acetaminophen (Tylenol, etc) 500-650mg  four times a day (every meal & bedtime) d. A  prescription for pain medication (such as oxycodone, hydrocodone, etc) should be given to you upon discharge.  Take your pain medication as prescribed.  i. If you are having problems/concerns with the prescription medicine (does not control pain, nausea, vomiting, rash, itching, etc), please call us [phone] to see if we need to switch you to a different pain medicine that will work better for you and/or control your side effect better. ii. If you need a refill on your pain medication, please contact your pharmacy.  They will contact our office to request authorization. Prescriptions will not be filled after 5 pm or on week-ends. 4. Avoid getting constipated.  Between the surgery and the pain medications, it is common to experience some constipation.  Increasing fluid intake and taking a fiber supplement (such as Metamucil, Citrucel, FiberCon, MiraLax, etc) 1-2 times a day regularly will usually help prevent this problem from occurring.  A mild laxative (prune juice, Milk of Magnesia, MiraLax, etc) should be taken according to package directions if there are no bowel movements after 48 hours.   5. Watch out for diarrhea.  If you have many loose bowel movements, simplify your diet to bland foods & liquids for a few days.  Stop any stool softeners and decrease your fiber supplement.  Switching to mild anti-diarrheal medications (Kayopectate, Pepto Bismol) can help.  If this worsens or does not improve, please call us. 6. Wash / shower every day.  You may shower over the incision / wound.  Avoid baths until the skin is fully healed.  Continue to shower over incision(s) after the dressing is off. 7. Remove your waterproof bandages 5 days after surgery.  You  may leave the incision open to air.  You may replace a dressing/Band-Aid to cover the incision for comfort if you wish. 8. ACTIVITIES as tolerated:   a. You may resume regular (light) daily activities beginning the next day--such as daily self-care, walking, climbing stairs--gradually increasing activities as tolerated.  If you can walk 30 minutes without difficulty, it is safe to try more intense activity such as jogging, treadmill, bicycling, low-impact aerobics, swimming, etc. b. Save the most intensive and strenuous activity for last such as sit-ups, heavy lifting, contact sports, etc  Refrain from any heavy lifting or straining until you are off narcotics for pain control.   c. DO NOT PUSH THROUGH PAIN.  Let pain be your guide: If it hurts to do something, don't do it.  Pain is your body warning you to avoid that activity for another week until the pain goes down. d. You may drive when you are no longer taking prescription pain medication, you  can comfortably wear a seatbelt, and you can safely maneuver your car and apply brakes. e. Dennis Bast may have sexual intercourse when it is comfortable.  9. FOLLOW UP in our office a. Please call [facility] at [phone] to set up an appointment to see your surgeon in the office for a follow-up appointment approximately 1-2 weeks after your surgery. b. Make sure that you call for this appointment the day you arrive home to insure a convenient appointment time. 10. IF YOU HAVE DISABILITY OR FAMILY LEAVE FORMS, BRING THEM TO THE OFFICE FOR PROCESSING.  DO NOT GIVE THEM TO YOUR DOCTOR.   WHEN TO CALL us [phone]: 1. Poor pain control 2. Reactions / problems with new medications (rash/itching, nausea, etc)  3. Fever over 101.5 F (38.5 C) 4. Inability to urinate 5. Nausea and/or vomiting 6. Worsening swelling or bruising 7. Continued bleeding from incision. 8. Increased pain, redness, or drainage from the incision  The clinic staff is available to answer  your questions during regular business hours (8:30am-5pm).  Please dont hesitate to call and ask to speak to one of our nurses for clinical concerns.   A surgeon from [facility] is always on call at the hospitals   If you have a medical emergency, go to the nearest emergency room or call 911.    [facility], [address], [address], [place], [place]  [address] ? MAIN: [phone] ? TOLL FREE: [phone] ? FAX [phone] [email or web address]

## 2014-11-10 LAB — CBC
HCT: 31.1 % — ABNORMAL LOW (ref 39.0–52.0)
Hemoglobin: 9.6 g/dL — ABNORMAL LOW (ref 13.0–17.0)
MCH: 25.5 pg — ABNORMAL LOW (ref 26.0–34.0)
MCHC: 30.9 g/dL (ref 30.0–36.0)
MCV: 82.7 fL (ref 78.0–100.0)
Platelets: 264 10*3/uL (ref 150–400)
RBC: 3.76 MIL/uL — AB (ref 4.22–5.81)
RDW: 16.9 % — ABNORMAL HIGH (ref 11.5–15.5)
WBC: 8.9 10*3/uL (ref 4.0–10.5)

## 2014-11-10 LAB — BASIC METABOLIC PANEL
ANION GAP: 3 — AB (ref 5–15)
BUN: 6 mg/dL (ref 6–23)
CALCIUM: 7.9 mg/dL — AB (ref 8.4–10.5)
CHLORIDE: 102 mmol/L (ref 96–112)
CO2: 30 mmol/L (ref 19–32)
CREATININE: 0.63 mg/dL (ref 0.50–1.35)
GFR calc Af Amer: >90 mL/min (ref >90)
GFR calc non Af Amer: >90 mL/min (ref >90)
Glucose, Bld: 98 mg/dL (ref 70–99)
Potassium: 3.8 mmol/L (ref 3.5–5.1)
Sodium: 135 mmol/L (ref 135–145)

## 2014-11-10 NOTE — Plan of Care (Signed)
Problem: Phase II Progression Outcomes Goal: Foley discontinued Outcome: Not Applicable Date Met:  02/33/43 Pt to be d/c with catheter.

## 2014-11-10 NOTE — Progress Notes (Signed)
3 Days Post-Op  Subjective: Feeling well. Pain under control with PO pain meds. Ambulating independently often. No nausea, vomiting or bloating. Since JP removal has return of flatus with 3 soft/non-bloody BMs. Inquires about advancing diet and discharge.  Objective: Vital signs in last 24 hours: Temp:  [97.9 F (36.6 C)-98.3 F (36.8 C)] 98.1 F (36.7 C) (03/05 0545) Pulse Rate:  [65-73] 65 (03/05 0545) Resp:  [15-18] 16 (03/05 0545) BP: (119-131)/(55-74) 131/74 mmHg (03/05 0545) SpO2:  [96 %-98 %] 96 % (03/05 0545) Last BM Date: 11/09/14  Intake/Output from previous day: 03/04 0701 - 03/05 0700 In: 1465.1 [P.O.:840; I.V.:475.1; IV Piggyback:150] Out: 1950 [Urine:1950] Intake/Output this shift:    PE: General- In NAD Abdomen- Soft with tenderness around incision sites. Incisions clean and dressed.  Lab Results:   Recent Labs  11/09/14 0459 11/10/14 0532  WBC 9.2 8.9  HGB 10.1* 9.6*  HCT 32.4* 31.1*  PLT 275 264   BMET  Recent Labs  11/09/14 0459 11/10/14 0532  NA 137 135  K 3.7 3.8  CL 101 102  CO2 31 30  GLUCOSE 119* 98  BUN 6 6  CREATININE 0.80 0.63  CALCIUM 8.0* 7.9*   PT/INR No results for input(s): LABPROT, INR in the last 72 hours. Comprehensive Metabolic Panel:    Component Value Date/Time   NA 135 11/10/2014 0532   NA 137 11/09/2014 0459   K 3.8 11/10/2014 0532   K 3.7 11/09/2014 0459   CL 102 11/10/2014 0532   CL 101 11/09/2014 0459   CO2 30 11/10/2014 0532   CO2 31 11/09/2014 0459   BUN 6 11/10/2014 0532   BUN 6 11/09/2014 0459   CREATININE 0.63 11/10/2014 0532   CREATININE 0.80 11/09/2014 0459   GLUCOSE 98 11/10/2014 0532   GLUCOSE 119* 11/09/2014 0459   CALCIUM 7.9* 11/10/2014 0532   CALCIUM 8.0* 11/09/2014 0459     Studies/Results: No results found.  Anti-infectives: Anti-infectives    Start     Dose/Rate Route Frequency Ordered Stop   11/07/14 2200  cefoTEtan (CEFOTAN) 2 g in dextrose 5 % 50 mL IVPB  Status:   Discontinued     2 g 100 mL/hr over 30 Minutes Intravenous Every 12 hours 11/07/14 1645 11/07/14 1717   11/07/14 1430  cefoTEtan (CEFOTAN) 2 g in dextrose 5 % 50 mL IVPB  Status:  Discontinued     2 g 100 mL/hr over 30 Minutes Intravenous Every 12 hours 11/07/14 1415 11/09/14 0746   11/07/14 0636  cefoTEtan (CEFOTAN) 2 g in dextrose 5 % 50 mL IVPB     2 g 100 mL/hr over 30 Minutes Intravenous On call to O.R. 11/07/14 0258 11/07/14 0842   11/07/14 0000  ciprofloxacin (CIPRO) 500 MG tablet     500 mg Oral 2 times daily 11/07/14 1352        Assessment Active Problems:   Colon cancer    LOS: 3 days   Plan: Advance diet to soft -bland. If tolerated anticipate discharge later today/tomorrow.   Gwenith Spitz PA-S 11/10/2014

## 2014-11-11 MED ORDER — OXYCODONE HCL 5 MG PO TABS: 5.0000 mg | ORAL_TABLET | ORAL | Status: AC | PRN

## 2014-11-11 NOTE — Discharge Summary (Signed)
Reviewed d/c instructions with pt including medications, follow-up appointment, and precautions.  Reviewed Foley catheter care (first discussed yesterday) and demonstrated attaching leg bag and large drainage bag.  Supplied pt with new bags in both sizes.  Pt able to teach back changing process and keep bags below waist and on clean surface (such as on towel versus floor).  Pt verbalized understanding of all topics discussed.  Being d/c into care of friend who is also a Marine scientist.

## 2014-11-11 NOTE — Discharge Summary (Signed)
Physician Discharge Summary  Patient ID: [name] MRN: [id number] DOB/AGE: [birth year] 64 y.o.  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  He had some ABL anemia but did not require a transfusion.  He met criteria for discharge by his fourth postop day and was discharged home.  He will be discharge with the foley in.  Discharge instructions were given to him.  Consults: urology   Discharge Exam: Blood pressure 144/75, pulse 66, temperature 98.2 F (36.8 C), temperature source Oral, resp. rate 16, height 5' 9"  (1.753 m), weight 184 lb (83.462 kg), SpO2 99 %.   Disposition: Discharge to home.     Medication List    STOP taking these medications        predniSONE 5 MG tablet  Commonly known as:  DELTASONE     tamsulosin 0.4 MG Caps capsule  Commonly known as:  FLOMAX      TAKE these medications        ciprofloxacin 500 MG tablet  Commonly known as:  CIPRO  Take 1 tablet (500 mg total) by mouth 2 (two) times daily. Start day prior to office visit for foley removal     HYDROcodone-acetaminophen 5-325 MG per tablet  Commonly known as:  NORCO  Take 1-2 tablets by mouth every 6 (six) hours as needed.     indomethacin 25 MG capsule  Commonly known as:  INDOCIN  Take 25 mg by mouth 2 times daily at 12 noon and 4 pm. As needed for gout flare up     lisinopril 20 MG tablet  Commonly known as:  PRINIVIL,ZESTRIL  Take 20 mg by mouth daily.           Follow-up Information    Follow up with Alexis Frock, MD On 11/20/2014.   Specialty:  Urology   Why:  at 11:15   Contact information:   [place] [address] [phone]       Follow up with  Rosario Adie., MD. Schedule an appointment as soon as possible for a visit in 2 weeks.   Specialty:  General Surgery   Contact information:   [address] [place] [address] [phone]       Signed: Odis Hollingshead 11/11/2014, 7:41 AM

## 2014-11-11 NOTE — Progress Notes (Signed)
4 Days Post-Op  Subjective: Tolerated solid diet.  Learning foley catheter care.  Bowels moving.  Objective: Vital signs in last 24 hours: Temp:  [98 F (36.7 C)-98.4 F (36.9 C)] 98.2 F (36.8 C) (03/06 0636) Pulse Rate:  [66-74] 66 (03/06 0636) Resp:  [16-18] 16 (03/06 0636) BP: (113-144)/(69-107) 144/75 mmHg (03/06 0636) SpO2:  [97 %-99 %] 99 % (03/06 0636) Last BM Date: 11/10/14  Intake/Output from previous day: 03/05 0701 - 03/06 0700 In: 360 [P.O.:240; I.V.:120] Out: 400 [Urine:400] Intake/Output this shift:    PE: General- In NAD Abdomen-soft, incisions are clean and intact  Lab Results:   Recent Labs  11/09/14 0459 11/10/14 0532  WBC 9.2 8.9  HGB 10.1* 9.6*  HCT 32.4* 31.1*  PLT 275 264   BMET  Recent Labs  11/09/14 0459 11/10/14 0532  NA 137 135  K 3.7 3.8  CL 101 102  CO2 31 30  GLUCOSE 119* 98  BUN 6 6  CREATININE 0.80 0.63  CALCIUM 8.0* 7.9*   PT/INR No results for input(s): LABPROT, INR in the last 72 hours. Comprehensive Metabolic Panel:    Component Value Date/Time   NA 135 11/10/2014 0532   NA 137 11/09/2014 0459   K 3.8 11/10/2014 0532   K 3.7 11/09/2014 0459   CL 102 11/10/2014 0532   CL 101 11/09/2014 0459   CO2 30 11/10/2014 0532   CO2 31 11/09/2014 0459   BUN 6 11/10/2014 0532   BUN 6 11/09/2014 0459   CREATININE 0.63 11/10/2014 0532   CREATININE 0.80 11/09/2014 0459   GLUCOSE 98 11/10/2014 0532   GLUCOSE 119* 11/09/2014 0459   CALCIUM 7.9* 11/10/2014 0532   CALCIUM 8.0* 11/09/2014 0459     Studies/Results: No results found.  Anti-infectives: Anti-infectives    Start     Dose/Rate Route Frequency Ordered Stop   11/07/14 2200  cefoTEtan (CEFOTAN) 2 g in dextrose 5 % 50 mL IVPB  Status:  Discontinued     2 g 100 mL/hr over 30 Minutes Intravenous Every 12 hours 11/07/14 1645 11/07/14 1717   11/07/14 1430  cefoTEtan (CEFOTAN) 2 g in dextrose 5 % 50 mL IVPB  Status:  Discontinued     2 g 100 mL/hr over 30 Minutes  Intravenous Every 12 hours 11/07/14 1415 11/09/14 0746   11/07/14 0636  cefoTEtan (CEFOTAN) 2 g in dextrose 5 % 50 mL IVPB     2 g 100 mL/hr over 30 Minutes Intravenous On call to O.R. 11/07/14 [id number] 11/07/14 0842   11/07/14 0000  ciprofloxacin (CIPRO) 500 MG tablet     500 mg Oral 2 times daily 11/07/14 1352        Assessment Active Problems:  COLON CANCER S/P ROBOT ASSISTED LAPAROSCOPIC PARTIAL COLECTOMY 11/07/14-doing well XI ROBOTIC ASSISTED LAPAROSCOPIC RADICAL PROSTATECTOMY WITH NODES    LOS: 4 days   Plan: Discharge today with foley in.  Discharge instructions given to him.   Lilliahna Schubring J 11/11/2014

## 2014-11-14 ENCOUNTER — Telehealth (INDEPENDENT_AMBULATORY_CARE_PROVIDER_SITE_OTHER): Payer: Self-pay | Admitting: General Surgery

## 2014-11-14 NOTE — Telephone Encounter (Signed)
Attempted to call pt about his pathology report.  No answer at either number.  No voicemail provided.

## 2014-11-30 ENCOUNTER — Telehealth: Payer: Self-pay | Admitting: Oncology

## 2014-11-30 NOTE — Telephone Encounter (Signed)
CALLED PT AND CONFIRMED APPT WITH DR. [facility]  SHERRILL 12/10/14 1:30PM

## 2014-12-10 ENCOUNTER — Encounter: Payer: Self-pay | Admitting: Oncology

## 2014-12-10 ENCOUNTER — Telehealth: Payer: Self-pay | Admitting: Oncology

## 2014-12-10 ENCOUNTER — Ambulatory Visit (HOSPITAL_BASED_OUTPATIENT_CLINIC_OR_DEPARTMENT_OTHER): Payer: PPO | Admitting: Oncology

## 2014-12-10 ENCOUNTER — Other Ambulatory Visit (HOSPITAL_BASED_OUTPATIENT_CLINIC_OR_DEPARTMENT_OTHER): Payer: PPO

## 2014-12-10 ENCOUNTER — Ambulatory Visit: Payer: PPO

## 2014-12-10 VITALS — BP 142/74 | HR 87 | Temp 98.7°F | Resp 18 | Ht 69.0 in | Wt 175.6 lb

## 2014-12-10 DIAGNOSIS — C61 Malignant neoplasm of prostate: Secondary | ICD-10-CM | POA: Diagnosis not present

## 2014-12-10 DIAGNOSIS — N39498 Other specified urinary incontinence: Secondary | ICD-10-CM

## 2014-12-10 DIAGNOSIS — B372 Candidiasis of skin and nail: Secondary | ICD-10-CM

## 2014-12-10 DIAGNOSIS — I10 Essential (primary) hypertension: Secondary | ICD-10-CM | POA: Diagnosis not present

## 2014-12-10 DIAGNOSIS — C189 Malignant neoplasm of colon, unspecified: Secondary | ICD-10-CM

## 2014-12-10 DIAGNOSIS — C187 Malignant neoplasm of sigmoid colon: Secondary | ICD-10-CM

## 2014-12-10 MED ORDER — NYSTATIN 100000 UNIT/GM EX POWD: Freq: Three times a day (TID) | CUTANEOUS | Status: AC

## 2014-12-10 NOTE — [facility] Oncology Navigator Note (Signed)
Met with patient and significant other during new patient visit. Explained the role of the GI Nurse Navigator and provided New Patient Packet with information on: 1. Colon cancer 2. Support groups 3. Advanced Directives 4. Fall Safety Plan Answered questions, reviewed current treatment plan using TEACH back and provided emotional support. Provided copy of current treatment plan (see media). No referrals are needed at this time.  Merceda Elks, RN, BSN GI Oncology [place]

## 2014-12-10 NOTE — Progress Notes (Signed)
[facility] [place] Patient Consult   Referring MD: Brentton Wardlow Wiltrout 64 y.o.  05/28/1951    Reason for Referral: Colon cancer   HPI: Mr. Gonce reports a several month history of intermittent rectal bleeding. He was referred to Dr.Magod and was taken to a colonoscopy procedure on 10/09/2014. An ulcerated partially obstructing mass was found in the mid sigmoid colon. The mass was circumferential. Biopsies were taken. The area was tattooed. 5 benign appearing polyps were found in the rectum and in the proximal ascending colon. The polyps were biopsied. The pathology confirmed 2 bladder adenomas and hyperplastic polyps in the ascending colon and rectum. The sigmoid colon mass returned as invasive adenocarcinoma.  He was referred for CTs of the abdomen and pelvis on 10/11/2014. Mild circumferential distal esophageal wall thickening was noted. No hepatic metastatic disease. A sigmoid colon mass with surrounding mesenteric stranding and probable local nodal metastases were noted. The prostate was noted to be mildly enlarged. A CT of the chest on 10/19/2014 a right middle lobe bulla with slightly thickened walls was noted.no pulmonary nodules.  He was noted have an elevated PSA and was diagnosed with prostate cancer by Dr. Diona Fanti. He was found to have a Gleason 7 prostate cancer. He was referred to Dr. Tresa Moore. Mr. Worrel was taken to a combined robotic-assisted sigmoidectomy and radical prostatectomy on 11/07/2014. A large mass was noted in the sigmoid colon. No metastatic disease on the peritoneum or liver.  The pathology ([id number]) is confirmed a moderately differentiated adenocarcinoma the sigmoid colon measuring 8.4 cm. Tumor invaded into pericolonic connective tissue. The resection margins were negative. 21 lymph nodes were negative for metastatic carcinoma. No. Neural invasion. Focal lymphovascular invasion was noted. No macroscopic tumor perforation. No loss  of mismatch protein expression. The prostate cancer returned as a Gleason 7 tumor. The right and left bladder neck margins were positive for tumor. 17 lymph nodes negative for metastatic carcinoma. No lymphovascular invasion. Perineural invasion was noted.  He has urinary incontinence following the prostatectomy procedure.     Past Medical History  Diagnosis Date  . Hypertension   . Asthma     childhood; also triggered around certain molds  . Shortness of breath dyspnea     walking distances or climbing stairs   . GERD (gastroesophageal reflux disease)   . Seizures     hx of 10 to 15 years ago while at work occurred just one pt saw neurologist once no further followup   . Arthritis   . Cancer     prostate and colon   . Trauma to eye, left     has artificial eye accident related to lawnmower    .   Eczema    Past Surgical History  Procedure Laterality Date  . Mouth surgery      upper   . Colonscopy    10/09/2014   . Eye surgery  left   age 52   . Proctoscopy  11/07/2014    Procedure: PROCTOSCOPY;  Surgeon: [name], MD;  Location: [facility];  Service: General;;  . Robot assisted laparoscopic radical prostatectomy N/A 11/07/2014    Procedure: XI ROBOTIC ASSISTED LAPAROSCOPIC RADICAL PROSTATECTOMY WITH NODES;  Surgeon: Alexis Frock, MD;  Location: [facility];  Service: Urology;  Laterality: N/A;    Medications: Reviewed  Allergies: No Known Allergies  Family history: His father had prostate cancer. A sister had colon cancer and died of COPD. He had 3 sisters. One daughter.  No other family history of cancer.  Social History:   He lives in [place]. He works in Architect. He does not use tobacco. He is a heavy beer drinker, approximately 12 beers per day  History  Alcohol Use  . Yes    Comment: regular with beer and wine     History  Smoking status  . Never Smoker   Smokeless tobacco  . Never Used      ROS:   Positives include: Occasional dysphagia when  eating "apples "for years, constipation and rectal bleeding prior to surgery, urinary frequency, nocturia, incomplete emptying. Urinary incontinence following the prostatectomy. Rash in the groin following the prostatectomy  A complete ROS was otherwise negative.  Physical Exam:  Blood pressure 142/74, pulse 87, temperature 98.7 F (37.1 C), temperature source Oral, resp. rate 18, height 5' 9"  (1.753 m), weight 175 lb 9.6 oz (79.652 kg), SpO2 99 %.  HEENT: No upper teeth, oropharynx without visible mass, neck without mass Lungs: Clear bilaterally Cardiac: Regular rate and rhythm Abdomen: No hepato-splenomegaly, healed surgical incisions, nontender, no mass GU: Testes without mass  Vascular: No leg edema Lymph nodes: No cervical, supra-clavicular, axillary, or inguinal nodes Neurologic: Alert and oriented, the motor exam appears intact in the upper and lower extremities Skin: Erythematous maculopapular rash at the lateral back bilaterally, erythematous raised rash in the groin bilaterally Musculoskeletal: No spine tenderness   LAB:  CBC  Hemolymph 11.5, MCV 83.1 on 11/06/2014  CMP      Component Value Date/Time   NA 135 11/10/2014 0532   K 3.8 11/10/2014 0532   CL 102 11/10/2014 0532   CO2 30 11/10/2014 0532   GLUCOSE 98 11/10/2014 0532   BUN 6 11/10/2014 0532   CREATININE 0.63 11/10/2014 0532   CALCIUM 7.9* 11/10/2014 0532   GFRNONAA >90 11/10/2014 0532   GFRAA >90 11/10/2014 0532    CEA 9.2 on 10/16/2014   Imaging:  As per history of present illness, CT images reviewed Bone scan 11/01/2014-negative for metastatic disease  Assessment/Plan:   1. 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  Stage III (T3a, N0, M0) sees prostate cancer, Gleason 7, status post a robotic prostatectomy 11/07/2014, right and  left bladder neck margins positive         3.   Urinary incontinence secondary to the prostatectomy  4.   Hypertension   Disposition:   Mr. Trickett has been diagnosed with colon cancer and prostate cancer. He plans to follow-up with Dr. Tresa Moore for treatment recommendations regarding the prostate cancer. He may be a candidate for adjuvant radiation pending the PSA over the next few months.  I discussed the diagnosis of colon cancer with Mr. Bargo. We reviewed the details of the surgical pathology report. I discussed data supporting a benefit from adjuvant 5-fluorouracil based chemotherapy in patients with resected stage III colon cancer. The prognosis for patients with stage II colon cancer is better and there is controversy right surrounding a survival benefit with adjuvant chemotherapy in this setting. His tumor does not have "high risk "features other than the focal lymphovascular invasion. I estimate his disease-free survival to be in the 80% range in the absence of adjuvant chemotherapy.  He could expect a small potential absolute benefit with adjuvant 5-fluorouracil based chemotherapy. We specifically discussed capecitabine. I reviewed the potential toxicities associated with capecitabine including the chance  for mucositis, diarrhea, hematologic toxicity, rash, hyperpigmentation, and the hand/foot syndrome. He will attend a chemotherapy teaching class.  He is undecided on whether to pursue adjuvant capecitabine chemotherapy. He will return for an office visit and further discussion in one to 2 weeks.  He has been diagnosed with a high risk prostate cancer and will continue follow-up with Dr. Tresa Moore.  We will check a CEA when he returns for chemotherapy teaching class.  I discussed the case with Dr. Marcello Moores and Dr. Saralyn Pilar. They both indicated there was no tumor associated perforation. Dr. Saralyn Pilar will addend the surgical pathology report.  Approximate 50 minutes were spent with  patient today. The majority of the time was used for counseling and coordination of care.  [place], [place] 12/10/2014, 5:39 PM

## 2014-12-10 NOTE — Progress Notes (Signed)
Checked in new patient with no issues prior to seeing the dr. He has not been out of the country and has appt card.

## 2014-12-10 NOTE — Telephone Encounter (Signed)
Gave avs & calendar for April. °

## 2014-12-13 ENCOUNTER — Encounter: Payer: Self-pay | Admitting: *Deleted

## 2014-12-13 ENCOUNTER — Other Ambulatory Visit: Payer: PPO

## 2014-12-13 DIAGNOSIS — C189 Malignant neoplasm of colon, unspecified: Secondary | ICD-10-CM

## 2014-12-13 DIAGNOSIS — B372 Candidiasis of skin and nail: Secondary | ICD-10-CM

## 2014-12-13 NOTE — [facility] Oncology Navigator Note (Signed)
Requested MSI testing on pathology at Dr. Gearldine Shown request via email :  Patient: [name]: 11/07/2014 Client: [facility] Accession: [id number] Received: 11/07/2014 Charles Frock, MD DOB: 1951/02/24 Age: 64 Gender: [name]

## 2014-12-14 LAB — CEA: CEA: 1.4 ng/mL (ref 0.0–5.0)

## 2014-12-24 ENCOUNTER — Ambulatory Visit: Payer: PPO | Admitting: Oncology

## 2014-12-24 ENCOUNTER — Telehealth: Payer: Self-pay | Admitting: *Deleted

## 2014-12-24 NOTE — Telephone Encounter (Signed)
Called home # and spoke with his significant other, Georgianne. Langdon is doing well, forgot the appointment today. Thought it was tomorrow, then said they can't come tomorrow. Will reschedule and call back.

## 2014-12-26 ENCOUNTER — Other Ambulatory Visit: Payer: Self-pay | Admitting: *Deleted

## 2014-12-26 ENCOUNTER — Telehealth: Payer: Self-pay | Admitting: Oncology

## 2014-12-26 NOTE — Telephone Encounter (Signed)
vm is full at [facility]...no vm at home....Marland KitchenMarland Kitchen

## 2014-12-31 ENCOUNTER — Ambulatory Visit: Payer: PPO | Admitting: Oncology

## 2015-01-09 ENCOUNTER — Telehealth: Payer: Self-pay | Admitting: *Deleted

## 2015-01-09 NOTE — Telephone Encounter (Signed)
Called pt to see if he plans on following up here. He does wish to be rescheduled. Order to schedulers to call pt with appointment.

## 2015-01-15 ENCOUNTER — Telehealth: Payer: Self-pay | Admitting: Oncology

## 2015-01-15 NOTE — Telephone Encounter (Signed)
Called patient phone number multiple time no answer. Spoke with Caryn Section and will confirm appointment with patient.

## 2015-01-17 ENCOUNTER — Telehealth: Payer: Self-pay | Admitting: Oncology

## 2015-01-17 ENCOUNTER — Ambulatory Visit (HOSPITAL_BASED_OUTPATIENT_CLINIC_OR_DEPARTMENT_OTHER): Payer: PPO | Admitting: Oncology

## 2015-01-17 VITALS — BP 162/98 | HR 75 | Temp 98.0°F | Resp 20 | Ht 69.0 in | Wt 181.0 lb

## 2015-01-17 DIAGNOSIS — I10 Essential (primary) hypertension: Secondary | ICD-10-CM

## 2015-01-17 DIAGNOSIS — R32 Unspecified urinary incontinence: Secondary | ICD-10-CM | POA: Diagnosis not present

## 2015-01-17 DIAGNOSIS — C187 Malignant neoplasm of sigmoid colon: Secondary | ICD-10-CM | POA: Diagnosis not present

## 2015-01-17 DIAGNOSIS — C189 Malignant neoplasm of colon, unspecified: Secondary | ICD-10-CM

## 2015-01-17 DIAGNOSIS — C61 Malignant neoplasm of prostate: Secondary | ICD-10-CM | POA: Diagnosis not present

## 2015-01-17 NOTE — Progress Notes (Signed)
  [facility] OFFICE PROGRESS NOTE   Diagnosis: Colon cancer  INTERVAL HISTORY:   [name] returns for a scheduled visit. He missed an appointment last month. He reports no difficulty with bowel function. He has urinary incontinence. He attended a chemotherapy teaching class. Objective:  Vital signs in last 24 hours:  Blood pressure 162/98, pulse 75, temperature 98 F (36.7 C), temperature source Oral, resp. rate 20, height 5' 9" (1.753 m), weight 181 lb (82.101 kg), SpO2 99 %.   Resp: Lungs clear bilaterally Cardio: Regular rate and rhythm GI: No hepatosplenomegaly Vascular: No leg edema    Lab Results  Component Value Date   CEA 1.4 12/13/2014    Medications: I have reviewed the patient's current medications.  Assessment/Plan: 1. 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 Stage III (T3a, N0, M0)  prostate cancer, Gleason 7, status post a robotic prostatectomy 11/07/2014, right and left bladder neck margins positive    3. Urinary incontinence secondary to the prostatectomy  4. Hypertension    Disposition:  [name] was diagnosed with stage II colon cancer when he underwent a sigmoid colectomy on 11/07/2014. We discussed the prognosis and indication for adjuvant therapy again today. He would like to be followed with observation. He has a good prognosis. I think this is a good decision given his prognosis, the high-risk prostate cancer, and he is now 12 weeks out from surgery.  He will continue follow-up with Dr. Tresa Moore for management of the prostate cancer. He will return for an office visit and CEA in 5 months. He should have a one-year surveillance colonoscopy with Dr. Watt Climes next year.  We recommended he follow-up with his primary physician for management of  hypertension.  Betsy Coder, MD  01/17/2015  5:25 PM

## 2015-01-17 NOTE — Telephone Encounter (Signed)
Pt confirmed labs/ov per 05/12 POF, gave pt AVS and Calendar.... KJ °

## 2015-01-18 ENCOUNTER — Other Ambulatory Visit (HOSPITAL_COMMUNITY)
Admission: RE | Admit: 2015-01-18 | Discharge: 2015-01-18 | Disposition: A | Discharge status: Home / Self Care | Payer: PPO | Source: Ambulatory Visit | Attending: Oncology | Admitting: Oncology

## 2015-01-18 DIAGNOSIS — C189 Malignant neoplasm of colon, unspecified: Secondary | ICD-10-CM | POA: Insufficient documentation

## 2015-01-21 ENCOUNTER — Ambulatory Visit: Payer: PPO | Admitting: Oncology

## 2015-01-24 ENCOUNTER — Encounter (HOSPITAL_COMMUNITY): Payer: Self-pay

## 2015-06-13 IMAGING — CT CT ABD-PELV W/ CM
3 of 5 series · 12 of 36 positions shown, 18 images · IV contrast (READICAT/WATER & [ID] OMNI 300)
Comparison: None.

CLINICAL DATA: Sigmoid colon mass.  Prostate cancer.

EXAM:
CT ABDOMEN AND PELVIS WITH CONTRAST
TECHNIQUE: Multidetector CT imaging of the abdomen and pelvis was performed
using the standard protocol following bolus administration of
intravenous contrast.
CONTRAST:  100mL OMNIPAQUE IOHEXOL 300 MG/ML  SOLN

[Series 3: abd/pelvis with · axial · 0.70mm/px · z∈[-379,-29]mm · 8 of 90 slices shown, 13 images]
[im 10/90  soft-tissue]
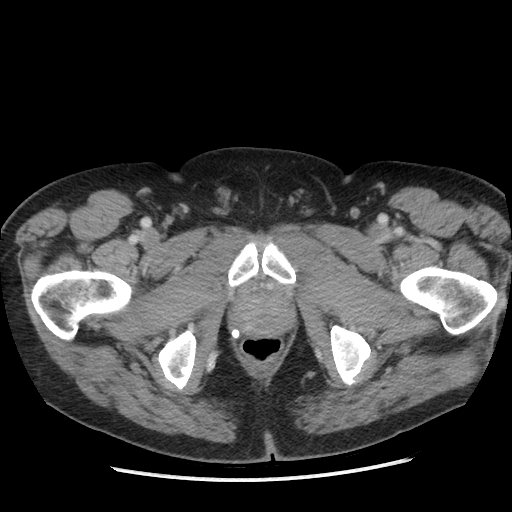
[im 10/90  bone]
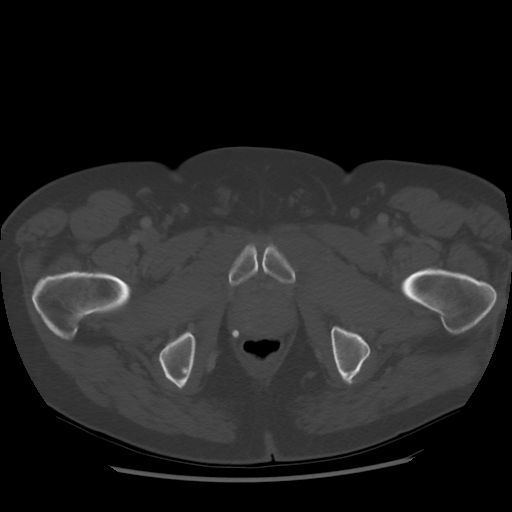
[im 20/90  soft-tissue]
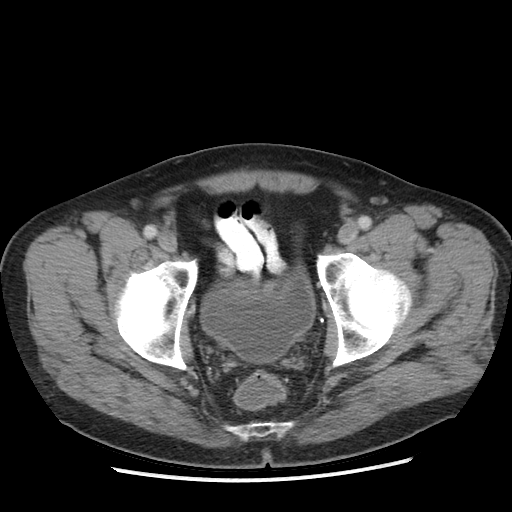
[im 30/90  soft-tissue]
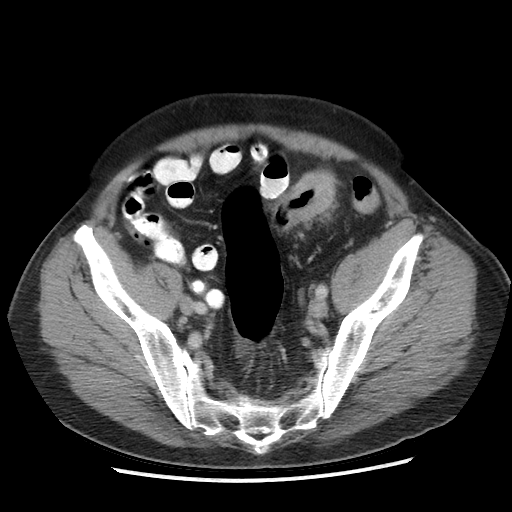
[im 40/90  soft-tissue]
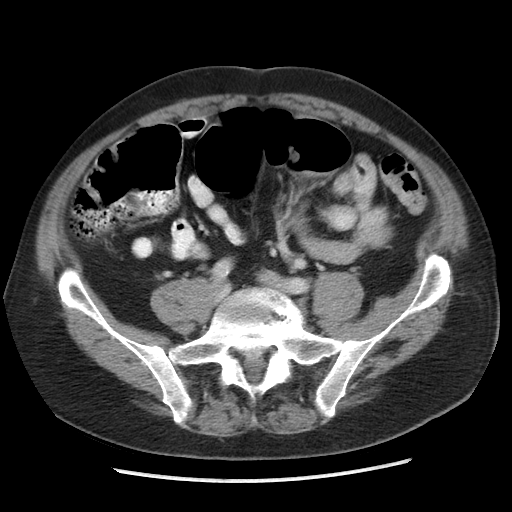
[im 50/90  soft-tissue]
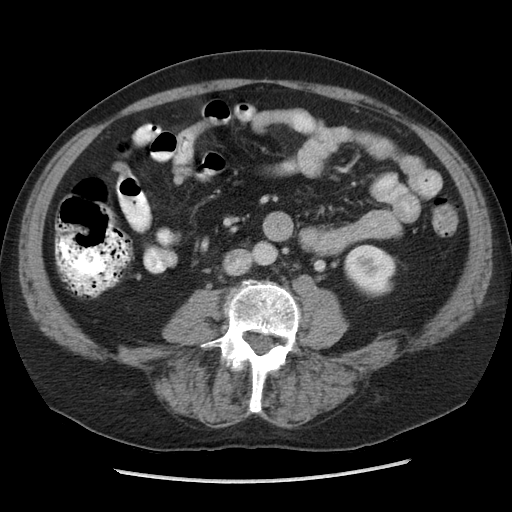
[im 50/90  lung]
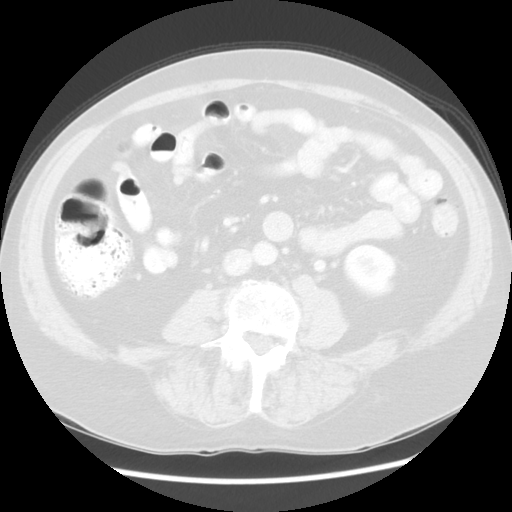
[im 60/90  soft-tissue]
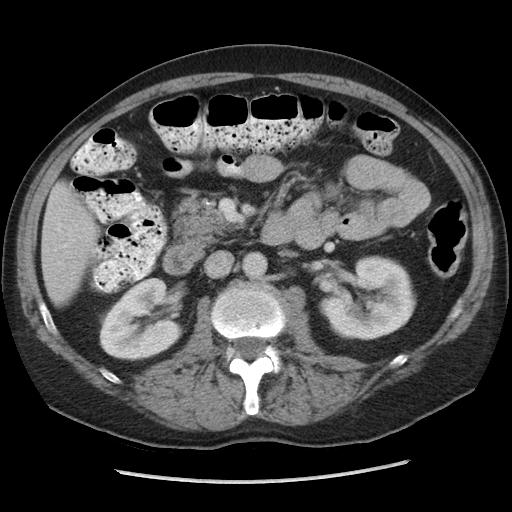
[im 60/90  lung]
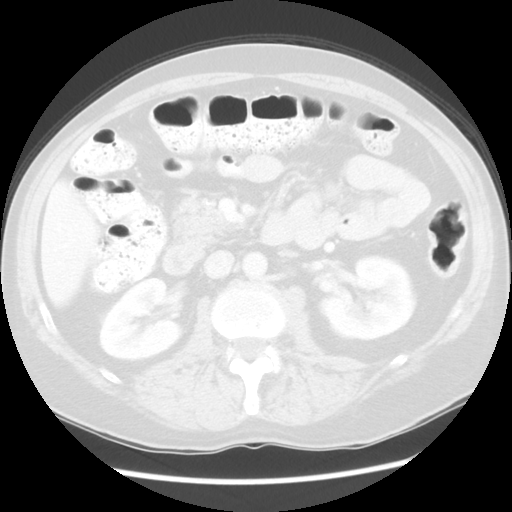
[im 70/90  soft-tissue]
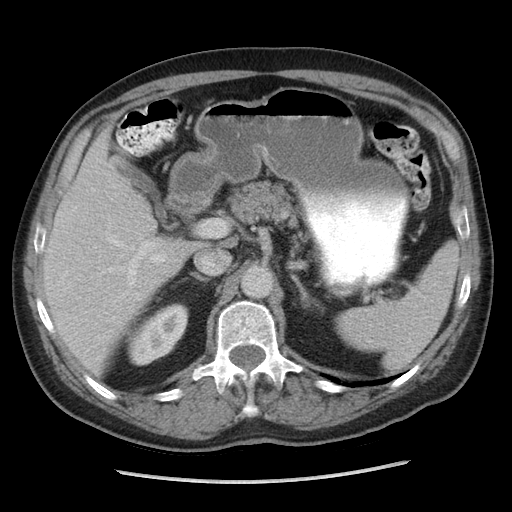
[im 70/90  lung]
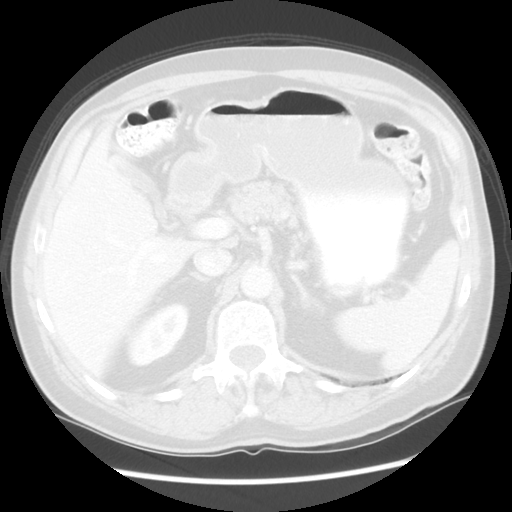
[im 80/90  soft-tissue]
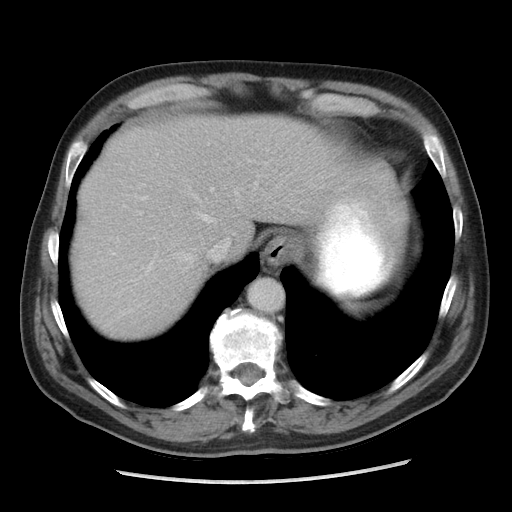
[im 80/90  lung]
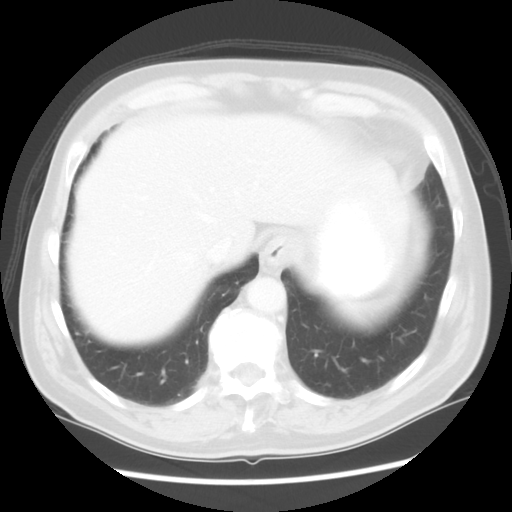

[Series 601: coronal body · coronal · 0.91mm/px · 1 of 122 slices shown, 2 images]
[im 41/122  soft-tissue]
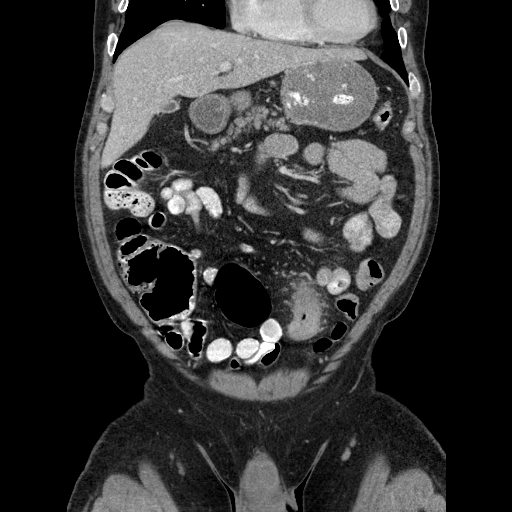
[im 41/122  bone]
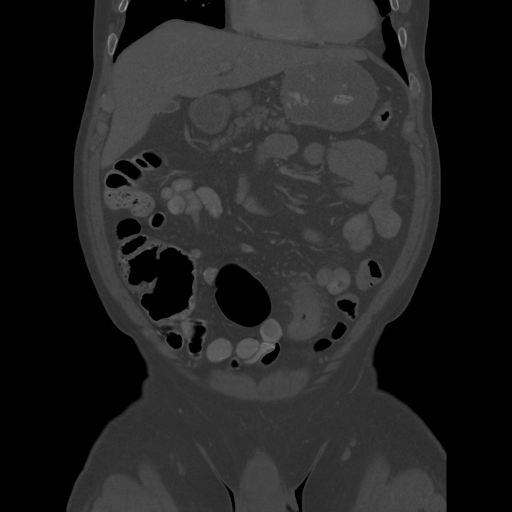

[Series 602: sagittal body · sagittal · 0.91mm/px · 3 of 145 slices shown]
[im 10/145  soft-tissue]
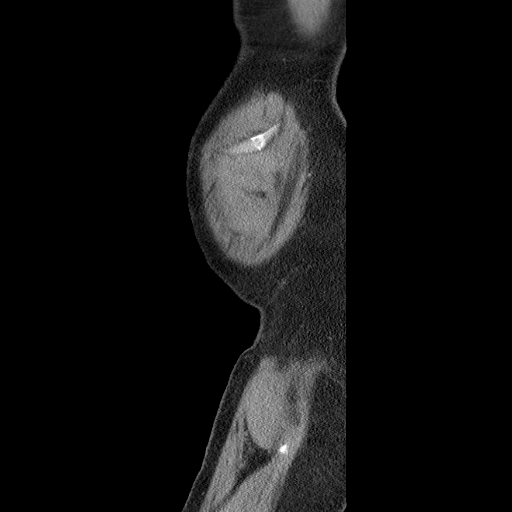
[im 29/145  soft-tissue]
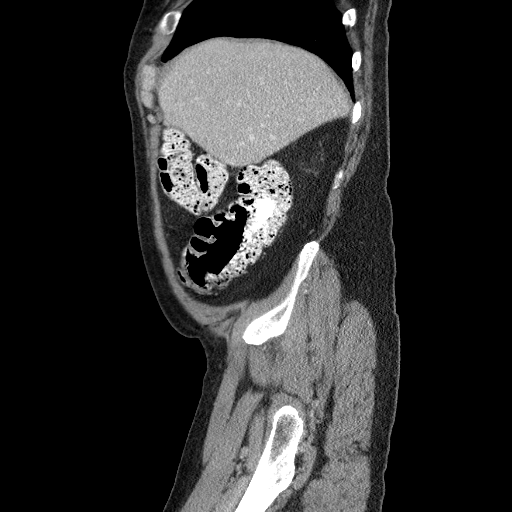
[im 49/145  soft-tissue]
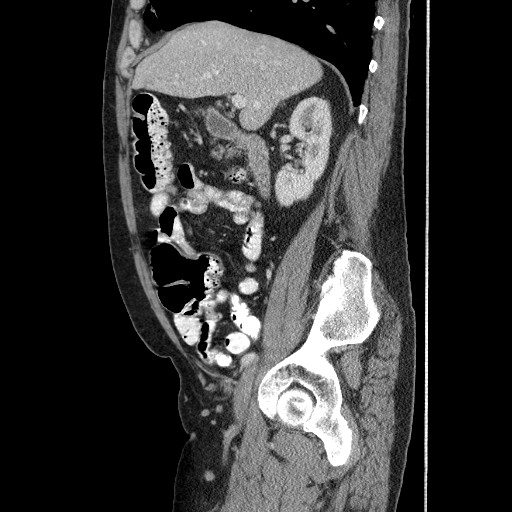

[12 of 36 positions shown; findings below may reference images not displayed]

FINDINGS: Lower chest: Slightly thick walled bulla, right middle lobe on image
4 of series 4, with an adjacent 3 mm nodule within the right middle
lobe. Mild circumferential distal esophageal wall thickening.

Hepatobiliary: Contracted gallbladder. No hepatic metastatic disease
is identified.

Pancreas: Unremarkable

Spleen: Unremarkable

Adrenals/Urinary Tract: Unremarkable

Stomach/Bowel: Small noninflamed transverse duodenal diverticulum.

Prominent sigmoid colon mass observed on images 53 through 64 of
series 3 with abnormal stranding stranding in the surrounding
mesentery which may represent localized extension of tumor, and
abnormal surrounding mesenteric nodal structures which are highly
likely to represent local nodal metastases. One of these index lymph
nodes measures 9 mm in short axis on image 56 of series 3.

Vascular/Lymphatic: Aortoiliac atherosclerotic vascular disease. No
pathologic periaortic adenopathy is observed. Small pelvic lymph
nodes are not pathologically enlarged by size criteria. As noted
above there are localized metastatic lymph nodes adjacent to the
large sigmoid colon tumor.

Reproductive: Prostate gland measures 5.1 by 4.0 cm, without obvious
asymmetry or asymmetry of the seminal vesicles.

Other: No supplemental non-categorized findings.

Musculoskeletal: A transitional lumbosacral vertebra is assumed to
represent the S1 level. Lumbar spondylosis and degenerative disc
disease particularly at L4-5, with degenerative endplate sclerosis
at this level.
IMPRESSION: 1. Large sigmoid colon mass, indistinct stranding in the surrounding
mesenteric which could represent local extension of tumor beyond the
colon wall, and surrounding abnormal lymph nodes compatible with
local no oral metastatic disease. No definite liver mass is
identified.
2. Prostate gland mildly enlarged but otherwise unremarkable in
appearance by CT.
3. Mildly thick walled bulla in the right middle lobe, probably from
remote prior inflammation. Adjacent 3 mm nodule. The odds of low
grade malignancy are considered low, and this can probably be safely
followed on typical followup imaging regimens associated with
treatment of the patient's sigmoid colon malignancy.
4. Lumbar spondylosis and degenerative disc disease.

## 2015-06-25 ENCOUNTER — Ambulatory Visit: Payer: PPO | Admitting: Oncology

## 2015-06-25 ENCOUNTER — Other Ambulatory Visit: Payer: PPO

## 2015-07-29 ENCOUNTER — Other Ambulatory Visit: Payer: Self-pay | Admitting: General Surgery

## 2015-07-29 DIAGNOSIS — C187 Malignant neoplasm of sigmoid colon: Secondary | ICD-10-CM
